# Patient Record
Sex: Male | Born: 1955 | Race: White | Hispanic: No | Marital: Married | Smoking: Never smoker
Health system: Southern US, Community
[De-identification: ages and names within clinical notes are randomized; demographics above are authoritative.]

## PROBLEM LIST (undated history)

## (undated) DIAGNOSIS — Z460 Encounter for fitting and adjustment of spectacles and contact lenses: Secondary | ICD-10-CM

## (undated) DIAGNOSIS — J40 Bronchitis, not specified as acute or chronic: Secondary | ICD-10-CM

## (undated) DIAGNOSIS — E785 Hyperlipidemia, unspecified: Secondary | ICD-10-CM

## (undated) DIAGNOSIS — R569 Unspecified convulsions: Secondary | ICD-10-CM

## (undated) HISTORY — PX: VASECTOMY: SHX75

## (undated) HISTORY — DX: Hyperlipidemia, unspecified: E78.5

## (undated) HISTORY — DX: Unspecified convulsions: R56.9

## (undated) HISTORY — PX: TONSILLECTOMY: SUR1361

---

## 2008-12-25 ENCOUNTER — Ambulatory Visit: Payer: Self-pay | Admitting: Sports Medicine

## 2008-12-25 DIAGNOSIS — M79609 Pain in unspecified limb: Secondary | ICD-10-CM

## 2008-12-25 DIAGNOSIS — M2020 Hallux rigidus, unspecified foot: Secondary | ICD-10-CM

## 2008-12-25 DIAGNOSIS — M7750 Other enthesopathy of unspecified foot: Secondary | ICD-10-CM | POA: Insufficient documentation

## 2008-12-25 DIAGNOSIS — M2140 Flat foot [pes planus] (acquired), unspecified foot: Secondary | ICD-10-CM | POA: Insufficient documentation

## 2009-01-17 ENCOUNTER — Ambulatory Visit: Payer: Self-pay | Admitting: Sports Medicine

## 2009-03-31 ENCOUNTER — Emergency Department (HOSPITAL_COMMUNITY): Admission: EM | Admit: 2009-03-31 | Discharge: 2009-03-31 | Payer: Self-pay | Admitting: Family Medicine

## 2010-10-18 LAB — POCT RAPID STREP A (OFFICE): Streptococcus, Group A Screen (Direct): POSITIVE — AB

## 2010-11-04 ENCOUNTER — Encounter: Payer: Self-pay | Admitting: *Deleted

## 2010-11-13 ENCOUNTER — Encounter: Payer: Self-pay | Admitting: Family Medicine

## 2010-11-13 ENCOUNTER — Ambulatory Visit: Payer: Self-pay | Admitting: Family Medicine

## 2010-11-13 ENCOUNTER — Ambulatory Visit (INDEPENDENT_AMBULATORY_CARE_PROVIDER_SITE_OTHER): Payer: BC Managed Care – PPO | Admitting: Family Medicine

## 2010-11-13 VITALS — BP 143/93 | HR 66 | Ht 67.0 in | Wt 200.0 lb

## 2010-11-13 DIAGNOSIS — M79609 Pain in unspecified limb: Secondary | ICD-10-CM

## 2010-11-13 DIAGNOSIS — M79672 Pain in left foot: Secondary | ICD-10-CM

## 2010-11-13 NOTE — Patient Instructions (Signed)
You have plantar fasciitis Take tylenol or aleve as needed for pain  Plantar fascia stretch for 20-30 seconds (do 3 of these) in morning Lowering/raise on a step exercises 3 x 15 once or twice a day - this is very important for long term recovery. Can add heel walks, toe walks forward and backward as well Ice bucket 10-15 minutes at end of day Avoid flat shoes/barefoot walking as much as possible. Arch straps have been shown to help with pain. Heel lifts also help with pain by avoiding fully stretching the plantar fascia except when doing home exercises (your inserts have this built into them). Steroid injection is a consideration for short term pain relief if you are struggling (better when pain is on inside of heel though, unlikely to help you). Physical therapy is also an option. >90% improve by 12 months with or without treatment but the above things improve the condition faster. Follow up with me in 6 weeks or as needed (if you are better).

## 2010-11-16 ENCOUNTER — Encounter: Payer: Self-pay | Admitting: Family Medicine

## 2010-11-16 DIAGNOSIS — M79672 Pain in left foot: Secondary | ICD-10-CM | POA: Insufficient documentation

## 2010-11-16 NOTE — Progress Notes (Signed)
  Subjective:    Patient ID: Darrell Hendrix, male    DOB: 1956/03/22, 55 y.o.   MRN: [id number]  HPI  55 yo M here for left heel pain.  Patient reports no known injury Had similar pain back in 1998 for which had cortisone injection which helped Pain started plantar portion of heel about 3-4 months ago. Worse in the mornings, better during day and with stretching. Standing after prolonged sitting also worsens pain. Has been icing Using custom orthotics - buys comforthotics as well and changes pads himself.  History reviewed. No pertinent past medical history.  Current Outpatient Prescriptions on File Prior to Visit  Medication Sig Dispense Refill  . Ascorbic Acid (VITAMIN C) 500 MG tablet NO INSTRUCTIONS GIVEN       . Glucosamine 500 MG CAPS NO INSTRUCTIONS GIVEN       . Omega-3 Fatty Acids (FISH OIL) 1000 MG CAPS NO INSTRUCTIONS GIVEN       . thiamine 500 MG tablet NO INSTRUCTIONS GIVEN (VITAMIN B-1 500 MG TABS)         History reviewed. No pertinent past surgical history.  No Known Allergies  History   Social History  . Marital Status: Single    Spouse Name: N/A    Number of Children: N/A  . Years of Education: N/A   Occupational History  . Not on file.   Social History Main Topics  . Smoking status: Never Smoker   . Smokeless tobacco: Never Used  . Alcohol Use: Not on file  . Drug Use: Not on file  . Sexually Active: Not on file   Other Topics Concern  . Not on file   Social History Narrative  . No narrative on file    Family History  Problem Relation Age of Onset  . Heart attack Father   . Diabetes Neg Hx   . Hypertension Neg Hx     BP 143/93  Pulse 66  Ht 5\' 7"  (1.702 m)  Wt 200 lb (90.719 kg)  BMI 31.32 kg/m2  Review of Systems See HPI above.    Objective:   Physical Exam Gen: NAD L foot: Mod overpronation with pes planus.  No other deformity, swelling, bruising. TTP plantar portion of foot proximal plantar fascia and at insertion on  heel laterally.  No achilles TTP.  No other TTP about foot/ankle. FROM ankle with 5/5 strength all motions. Negative ant drawer, talar tilt, thompsons. NVI distally.     Assessment & Plan:  1. Left foot pain - 2/2 plantar fasciitis.  Metatarsal pads and scaphoid pads placed on comforthotics.  Arch strap provided.  Shown home exercises and stretches to do daily.  Icing, tylenol/aleve as needed.  Pain is more lateral in plantar fascia - can consider injection if not improving and pain worsens over next 6 weeks.  See instructions for further.

## 2010-11-16 NOTE — Assessment & Plan Note (Signed)
2/2 plantar fasciitis.  Metatarsal pads and scaphoid pads placed on comforthotics.  Arch strap provided.  Shown home exercises and stretches to do daily.  Icing, tylenol/aleve as needed.  Pain is more lateral in plantar fascia - can consider injection if not improving and pain worsens over next 6 weeks.  See instructions for further.

## 2011-09-30 ENCOUNTER — Other Ambulatory Visit: Payer: Self-pay | Admitting: Neurology

## 2011-09-30 DIAGNOSIS — R442 Other hallucinations: Secondary | ICD-10-CM

## 2011-09-30 DIAGNOSIS — Z87898 Personal history of other specified conditions: Secondary | ICD-10-CM

## 2011-10-07 ENCOUNTER — Other Ambulatory Visit (HOSPITAL_COMMUNITY): Payer: Self-pay | Admitting: Neurology

## 2011-10-07 DIAGNOSIS — R569 Unspecified convulsions: Secondary | ICD-10-CM

## 2011-10-15 ENCOUNTER — Ambulatory Visit (HOSPITAL_COMMUNITY): Payer: BC Managed Care – PPO

## 2011-10-17 ENCOUNTER — Other Ambulatory Visit: Payer: BC Managed Care – PPO

## 2012-02-12 ENCOUNTER — Ambulatory Visit (INDEPENDENT_AMBULATORY_CARE_PROVIDER_SITE_OTHER): Payer: BC Managed Care – PPO | Admitting: Sports Medicine

## 2012-02-12 VITALS — BP 144/91 | Ht 67.0 in | Wt 210.0 lb

## 2012-02-12 DIAGNOSIS — M79609 Pain in unspecified limb: Secondary | ICD-10-CM

## 2012-02-12 DIAGNOSIS — M79672 Pain in left foot: Secondary | ICD-10-CM

## 2012-02-12 NOTE — Progress Notes (Signed)
Darrell Hendrix is a 55 y.o. male who presents to [facility] today for left heel pain.  Patient has had pain in his left heel for approximately 2 months.  This worsened when he restarted bicycling for exercise.  It was associated with the injury to his left calf that has resolved on its on in the interim.  He has been diagnosed with plantar fasciitis in the past and is wearing custom orthotics and most of his shoes.  He denies any numbness or tingle into his feet.  The pain is in the posterior plantar aspect of his left heel.     PMH reviewed.  History  Substance Use Topics  . Smoking status: Never Smoker   . Smokeless tobacco: Never Used  . Alcohol Use: Not on file   ROS as above otherwise neg   Exam:  BP 144/91  Ht 5\' 7"  (1.702 m)  Wt 210 lb (95.255 kg)  BMI 32.89 kg/m2 Gen: Well NAD MSK: Feet:  Significant pes planus bilaterally left worse than right with mid foot valgus and some subluxation.   He has posterior tibialis functioning on standing on toes.  MSK ultrasound: Spurring present on the plantar surface of the calcaneus associated with a thickened plantar fascia of 0.56 cm.

## 2012-02-12 NOTE — Patient Instructions (Addendum)
Thank you for coming in today. Please try  1) Ice your heel 15 mins 1-2x a day.  2) Slow calf raises and drops 15 reps 3 sets 2-3x a day.  3) Toe walking multiple sets a day.  4) Pull toe back and massage heel and mid foot.  Come back if your still have pain in 4-6 weeks.

## 2013-08-02 ENCOUNTER — Encounter (INDEPENDENT_AMBULATORY_CARE_PROVIDER_SITE_OTHER): Payer: Self-pay | Admitting: General Surgery

## 2013-08-11 ENCOUNTER — Ambulatory Visit (INDEPENDENT_AMBULATORY_CARE_PROVIDER_SITE_OTHER): Payer: BC Managed Care – PPO | Admitting: General Surgery

## 2013-08-17 ENCOUNTER — Encounter: Payer: Self-pay | Admitting: Emergency Medicine

## 2013-08-17 ENCOUNTER — Ambulatory Visit
Admission: RE | Admit: 2013-08-17 | Discharge: 2013-08-17 | Disposition: A | Discharge status: Home / Self Care | Payer: BC Managed Care – PPO | Source: Ambulatory Visit | Attending: Sports Medicine | Admitting: Sports Medicine

## 2013-08-17 ENCOUNTER — Ambulatory Visit (INDEPENDENT_AMBULATORY_CARE_PROVIDER_SITE_OTHER): Payer: BC Managed Care – PPO | Admitting: Emergency Medicine

## 2013-08-17 VITALS — BP 147/83 | Ht 67.0 in | Wt 225.0 lb

## 2013-08-17 DIAGNOSIS — M25522 Pain in left elbow: Secondary | ICD-10-CM

## 2013-08-17 DIAGNOSIS — M25529 Pain in unspecified elbow: Secondary | ICD-10-CM

## 2013-08-18 ENCOUNTER — Encounter: Payer: Self-pay | Admitting: Emergency Medicine

## 2013-08-18 DIAGNOSIS — M25522 Pain in left elbow: Secondary | ICD-10-CM | POA: Insufficient documentation

## 2013-08-18 NOTE — Assessment & Plan Note (Signed)
His exam today is concerning for intra-articular bony pathology and/or loose body within the joint. We'll start with an x-ray for further examination the elbow he will followup.

## 2013-08-18 NOTE — Progress Notes (Signed)
Patient ID: Wandra Arthurs., male   DOB: 1956-02-10, 58 y.o.   MRN: [id number] 58 year old male presents with complaint of left elbow pain. Gradually worsening left elbow pain for approximately 3 months. Onset with lifting weights. Complains of achy diffuse elbow pain. Reports limited ability to fully flex the elbow when doing arm curls. Pain is in the posterior lateral and posterior medial aspect of his left elbow. He does report that it got subjectively better with rest however started bothering him again and he continued to increase his activity.  No acute injury noted. No pain with grip.  Denies swelling.  Past medical history: Hernia surgery until this month.  Social history: Nonsmoker no alcohol  Review of systems:  No weakness or numbness noted, no shoulder pain no wrist pain. No fevers or chills. All other systems noted a as per history of present illness otherwise negative.  Examination: BP 147/83  Ht 5\' 7"  (1.702 m)  Wt 225 lb (102.059 kg)  BMI 35.23 kg/m2 Well-developed well-nourished 58 year old white male awake alert oriented no acute distress  Left elbow:  No swelling noted, negative ECRB testing, no tenderness to palpation over the medial or lateral epicondyles, no tenderness to palpation in the insertion of the tricep tendon, no bicipital tendon tenderness noted. Full range of motion with pronation supination flexion extension however, patient reports pain with extreme flexion of the elbow. No effusion or erythema noted.  Examination of the left older and wrist are unremarkable, as is examination of the right upper extremity  Neurovascularly intact bilateral upper extremities with equal pulses.

## 2013-08-19 ENCOUNTER — Encounter (INDEPENDENT_AMBULATORY_CARE_PROVIDER_SITE_OTHER): Payer: Self-pay

## 2013-08-19 ENCOUNTER — Encounter (INDEPENDENT_AMBULATORY_CARE_PROVIDER_SITE_OTHER): Payer: Self-pay | Admitting: General Surgery

## 2013-08-19 ENCOUNTER — Ambulatory Visit (INDEPENDENT_AMBULATORY_CARE_PROVIDER_SITE_OTHER): Payer: BC Managed Care – PPO | Admitting: General Surgery

## 2013-08-19 VITALS — BP 156/100 | HR 76 | Temp 98.0°F | Resp 14 | Ht 67.0 in | Wt 232.6 lb

## 2013-08-19 DIAGNOSIS — K429 Umbilical hernia without obstruction or gangrene: Secondary | ICD-10-CM | POA: Insufficient documentation

## 2013-08-19 NOTE — Patient Instructions (Signed)
Hernia A hernia occurs when an internal organ pushes out through a weak spot in the abdominal wall. Hernias most commonly occur in the groin and around the navel. Hernias often can be pushed back into place (reduced). Most hernias tend to get worse over time. Some abdominal hernias can get stuck in the opening (irreducible or incarcerated hernia) and cannot be reduced. An irreducible abdominal hernia which is tightly squeezed into the opening is at risk for impaired blood supply (strangulated hernia). A strangulated hernia is a medical emergency. Because of the risk for an irreducible or strangulated hernia, surgery may be recommended to repair a hernia. CAUSES   Heavy lifting.  Prolonged coughing.  Straining to have a bowel movement.  A cut (incision) made during an abdominal surgery. HOME CARE INSTRUCTIONS   Bed rest is not required. You may continue your normal activities.    Cough gently. If you are a smoker it is best to stop. Even the best hernia repair can break down with the continual strain of coughing. Even if you do not have your hernia repaired, a cough will continue to aggravate the problem.  Do not wear anything tight over your hernia. Do not try to keep it in with an outside bandage or truss. These can damage abdominal contents if they are trapped within the hernia sac.  Eat a normal diet.  Avoid constipation. Straining over long periods of time will increase hernia size and encourage breakdown of repairs. If you cannot do this with diet alone, stool softeners may be used. SEEK IMMEDIATE MEDICAL CARE IF:   You have a fever.  You develop increasing abdominal pain.  You feel nauseous or vomit.  Your hernia is stuck outside the abdomen, looks discolored, feels hard, or is tender.  You have any changes in your bowel habits or in the hernia that are unusual for you.  You have increased pain or swelling around the hernia.  You cannot push the hernia back in place by  applying gentle pressure while lying down. MAKE SURE YOU:   Understand these instructions.  Will watch your condition.  Will get help right away if you are not doing well or get worse. Document Released: 06/30/2005 Document Revised: 09/22/2011 Document Reviewed: 02/17/2008 [facility] Patient Information 2014 [place].

## 2013-08-19 NOTE — Progress Notes (Signed)
Patient ID: Darrell Arthurs., male   DOB: 1955-08-06, 58 y.o.   MRN: [id number]  Chief Complaint  Patient presents with  . New Evaluation    eval UMB hernia    HPI Darrell Hendrix. is a 58 y.o. male.   HPI 58 yo WM referred by Dr Hulan Fess for evaluation of umbilical hernia. The patient noticed a bulge at his umbilicus about 7-8/2 months ago. He is very active and works out quite a lot. He tends to lift weights very frequently. It doesn't really cause him any pain or discomfort. It has never been hard or swollen. He does notice when he does lift weights that it tends to pop out. It is always reducible. He does not smoke. He had a recent left arm injury and hasn't been able to lift or work out as much. He is a Radio producer. Past Medical History  Diagnosis Date  . Hyperlipidemia   . Seizures     Past Surgical History  Procedure Laterality Date  . Vasectomy      Family History  Problem Relation Age of Onset  . Heart attack Father   . Diabetes Neg Hx   . Hypertension Neg Hx   . Cancer Maternal Aunt     pancreatic  . Cancer Paternal Aunt     pancreatic    Social History History  Substance Use Topics  . Smoking status: Never Smoker   . Smokeless tobacco: Never Used  . Alcohol Use: Yes     Comment: moderate    No Known Allergies  Current Outpatient Prescriptions  Medication Sig Dispense Refill  . Ascorbic Acid (VITAMIN C) 500 MG tablet NO INSTRUCTIONS GIVEN       . Glucosamine 500 MG CAPS NO INSTRUCTIONS GIVEN       . Omega-3 Fatty Acids (FISH OIL) 1000 MG CAPS NO INSTRUCTIONS GIVEN       . thiamine 500 MG tablet NO INSTRUCTIONS GIVEN (VITAMIN B-1 500 MG TABS)        No current facility-administered medications for this visit.    Review of Systems Review of Systems  Constitutional: Negative for fever, chills, appetite change and unexpected weight change.  HENT: Negative for congestion and trouble swallowing.   Eyes: Negative for visual disturbance.    Respiratory: Negative for chest tightness and shortness of breath.   Cardiovascular: Negative for chest pain and leg swelling.       No PND, no orthopnea, no DOE  Gastrointestinal:       See HPI  Genitourinary: Negative for dysuria and hematuria.  Musculoskeletal: Negative.   Skin: Negative for rash.  Neurological: Negative for seizures and speech difficulty.       Denies amaurosis fugax and TIA  Hematological: Does not bruise/bleed easily.  Psychiatric/Behavioral: Negative for behavioral problems and confusion.    Blood pressure 156/100, pulse 76, temperature 98 F (36.7 C), temperature source Oral, resp. rate 14, height 5\' 7"  (1.702 m), weight 232 lb 9.6 oz (105.507 kg).  Physical Exam Physical Exam  Vitals reviewed. Constitutional: He is oriented to person, place, and time. He appears well-developed and well-nourished. No distress.  Stocky, muscular  HENT:  Head: Normocephalic and atraumatic.  Right Ear: External ear normal.  Left Ear: External ear normal.  Eyes: Conjunctivae are normal. No scleral icterus.  Neck: Normal range of motion. Neck supple. No tracheal deviation present. No thyromegaly present.  Cardiovascular: Normal rate, normal heart sounds and intact distal pulses.   Pulmonary/Chest: Effort  normal and breath sounds normal. No respiratory distress. He has no wheezes.  Abdominal: Soft. He exhibits no distension. There is no tenderness. There is no rebound and no guarding.    Small umbilical hernia, defect about 1.5-2cm. Soft, NT. Nothing protruding.   Musculoskeletal: Normal range of motion. He exhibits no edema and no tenderness.  Lymphadenopathy:    He has no cervical adenopathy.  Neurological: He is alert and oriented to person, place, and time. He exhibits normal muscle tone.  Skin: Skin is warm and dry. No rash noted. He is not diaphoretic. No erythema. No pallor.  Psychiatric: He has a normal mood and affect. His behavior is normal. Judgment and thought  content normal.    Data Reviewed Dr Rex Kras office note  Assessment    Umbilical hernia Elevated blood pressure     Plan    We discussed the etiology of umbilical hernias. We discussed the signs and symptoms of incarceration and strangulation. The patient was given educational material. I also drew diagrams.  We discussed nonoperative and operative management. With respect to operative management, we discussed  open repair  The patient has elected to proceed with OPEN REPAIR OF UMBILICAL HERNIA WITH POSSIBLE MESH  We discussed the risk and benefits of surgery including but not limited to bleeding, infection, injury to surrounding structures, hernia recurrence, mesh complications, hematoma/seroma formation, blood clot formation, urinary retention, post operative ileus, general anesthesia risk. We discussed the importance of avoiding heavy lifting and straining for a period of 4 weeks.   With respect to his blood pressure I advised him to check his blood pressure at random times as well as every morning. If his blood pressure remains elevated I advised him to followup with his primary care physician  Darrell Hendrix. [name], MD, FACS General, Bariatric, & Minimally Invasive Surgery [facility], [place]        [facility] M 08/19/2013, 3:36 PM

## 2013-08-22 ENCOUNTER — Telehealth: Payer: Self-pay | Admitting: Sports Medicine

## 2013-08-22 ENCOUNTER — Telehealth: Payer: Self-pay | Admitting: *Deleted

## 2013-08-22 NOTE — Telephone Encounter (Signed)
Message copied by Thurman Coyer on Mon Aug 22, 2013  8:22 AM ------      Message from: [facility], TODD M      Created: Thu Aug 18, 2013 11:28 AM                   ----- Message -----         From: Rad Results In Interface         Sent: 08/17/2013   4:45 PM           To: Hennie Duos, MD             ------

## 2013-08-22 NOTE — Telephone Encounter (Signed)
Message copied by Ocie Bob on Mon Aug 22, 2013  8:51 AM ------      Message from: Lilia Argue R      Created: Mon Aug 22, 2013  8:24 AM      Regarding: referral       Please refer this patient to Dr. Amada Jupiter. 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 See if the patient can be seen sometime Tuesday afternoon.            ----- Message -----         From: Hennie Duos, MD         Sent: 08/18/2013  11:28 AM           To: Thurman Coyer, DO                        ----- Message -----         From: Rad Results In Interface         Sent: 08/17/2013   4:45 PM           To: Hennie Duos, MD                   ------

## 2013-08-22 NOTE — Telephone Encounter (Signed)
Scheduled pt for appt with Dr. Amada Jupiter 08/23/13 at 3:30 pm.

## 2013-08-22 NOTE — Telephone Encounter (Signed)
I spoke with the patient on the phone last week regarding x-rays of his left elbow. He has significant degenerative changes in this elbow. His history makes me suspicious of loose bodies as well although they are difficult to see on plain x-ray. At this point in time I think it would be best that he see Dr. Amada Jupiter for surgical consultation. I'm not sure that further diagnostic imaging is needed but I will leave this to the discretion of Dr. Percell Miller with further treatment per Dr. Debroah Loop discretion as well.

## 2013-09-16 ENCOUNTER — Telehealth (INDEPENDENT_AMBULATORY_CARE_PROVIDER_SITE_OTHER): Payer: Self-pay | Admitting: General Surgery

## 2013-09-16 NOTE — Telephone Encounter (Signed)
FYI coordinated surgery w Dr Percell Miller date same 3/26 location changed to [place]

## 2013-09-26 ENCOUNTER — Other Ambulatory Visit: Payer: Self-pay | Admitting: Physician Assistant

## 2013-09-28 ENCOUNTER — Telehealth (INDEPENDENT_AMBULATORY_CARE_PROVIDER_SITE_OTHER): Payer: Self-pay | Admitting: *Deleted

## 2013-09-28 NOTE — Telephone Encounter (Signed)
Patient Darrell Hendrix on my VM regarding questions he has about lab work needed prior to surgery.  There are no orders in for labs.  Please address.

## 2013-09-29 NOTE — Telephone Encounter (Signed)
LMOM for patient to let him know that Dr [name] doesn't particularly need any pre op labs. Anesthesia will order any if they feel they are needed

## 2013-09-29 NOTE — Telephone Encounter (Signed)
i don't particularly need any preop labs. Anesthesia will order any if they feel they are needed

## 2013-10-03 ENCOUNTER — Encounter (HOSPITAL_BASED_OUTPATIENT_CLINIC_OR_DEPARTMENT_OTHER): Payer: Self-pay | Admitting: *Deleted

## 2013-10-03 NOTE — Progress Notes (Signed)
No labs needed-having umb hernia and elbow done-was sick last week-bronchitis-got meds-no fever-will call surgeon if still sick wed

## 2013-10-05 NOTE — H&P (Signed)
[facility] [address] [place], [place] [address] [phone] A Division of [facility]  Ninetta Lights, M.D.   Robert A. Noemi Chapel, M.D.   Faythe Casa, M.D.   Johnny Bridge, M.D.   Almedia Balls, M.D. Ernesta Amble. Percell Miller, M.D.  Joseph Pierini, M.D.  Lanier Prude, M.D.    Verner Chol, M.D. Mary L. Fenton Malling, PA-C  Kirstin A. Shepperson, PA-C  Josh Chilchinbito, PA-C [place], [place]  RE: Darrell Hendrix, Darrell Hendrix   [id number]      DOB: <<DOB>> INITIAL EVALUATION: 08-23-13 Edu is a new patient to the office.  He presents as a 58 year old male with issues of the left elbow.   Although longstanding, worse since December.  Significant episodes of locking, catching, loss of motion of the left elbow.  Relatively diffuse, more posterior than anterior.  No numbness, no  tingling.  Fairly discrete episodes of locking and catching when he tries to push the extremes of motion.  Pronation and supination tolerable.  It is flexion and extension that bother him the most.   Previous x-rays by Dr. Micheline Chapman early in February reveal a fair amount of arthritis.  He is referred for evaluation.  No previous operative intervention.  Works as a Pharmacist, hospital. Remaining history reviewed, updated and included in the chart.  Of note, he has an umbilical hernia and is being evaluated by Dr. [name], general surgery.  He has plans for a mini open repair of this on 10/06/2013 on an outpatient basis.  General exam is outlined and included in the chart.    EXAMINATION: Specifically this is a healthy appearing 58 year old.  5'7". 232 pounds.  Left elbow reveals 7-10 degree soft flexion contracture being limited by fullness and pain on the posterior aspect of his elbow.  He can flex to about 120 with a relatively firm end-point.  There is no instability.  Biceps and triceps intact.  No medial or lateral epicondylitis. Pronation and supination full and  comfortable.  Neurovascularly intact distally.  Opposite right elbow has full motion, no locking and no instability.   X-RAYS: 3-view x-ray of his elbow shows that the joint spaces are maintained but there is relatively significant periarticular spurring of what looks like loose bodies especially in the posterior aspect of the elbow.    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  MRI to look at soft tissue structures as well as loose bodies.  We have talked about the potential for arthroscopic decompression and debridement.  I am going to follow-up with him after his MRI to discuss where we are going to go depending on findings.  We have also talked a little bit about whether this could be done coincidentally with his umbilical hernia repair on an outpatient basis.  After I see his scan,  I will follow-up with Dr. [name] to see if that is an option.  He understands and agrees.     Ninetta Lights, M.D.  Electronically verified by Ninetta Lights, M.D. DFM:gde D 08-23-13 T 08-24-13 Cc:  [name], MD Fax [phone] Cc:  [name], D.O. fax [phone]   [place]. [place],  [id number] [phone] A Division of [facility]  Ninetta Lights, M.D.   Robert A. Noemi Chapel, M.D.   Faythe Casa, M.D.   Vonna Kotyk  Llana Aliment, M.D.   Almedia Balls, M.D. Ernesta Amble. Percell Miller, M.D.  Joseph Pierini, M.D.  Lanier Prude, M.D.    Verner Chol, M.D. Mary L. Fenton Malling, PA-C  Kirstin A. Shepperson, PA-C  Josh Farmington, PA-C [place], [place]  RE: Darrell Hendrix, Darrell Hendrix   [id number]      DOB: [birth year] PROGRESS NOTE: 09-06-13  [name] comes in for follow-up.  I have gone over his MRI report as well as the scan itself and discussed it with him.  Loose body anteriorly blocking flexion.  Off and on but  certainly not better.  Also loose bodies posteriorly with spurs off the tip of the olecranon blocking extension.  There is some mild lateral epicondylitis on the scan but examining him, there is really no medial or lateral epicondylitis. No instability.  Neurovascularly intact.   DISPOSITION: We have discussed definitive treatment.  I have thoroughly emphasized the degree of degenerative changes throughout the elbow.  Because this is a nonweightbearing joint, I think he stands a good chance of getting good improvement with loose body removal, spur removal and generalized debridement.  My hope is to put off progression to a total elbow for a long time and he understands.  I think he is realistic.  We have discussed outpatient arthroscopy.  Procedure, risks, benefits, complications reviewed.   Anticipated outcome and rehab also outlined.  All questions were answered.  I am going to try to do this in coordination with Dr. [name] who is going to be repairing an umbilical hernia on an outpatient basis.  I will see him at the time of operative intervention.      Ninetta Lights, M.D.  Electronically verified by Ninetta Lights, M.D. DFM:gde D 09-06-13 T 09-07-13 Cc:  [name], MD Fax [phone]

## 2013-10-06 ENCOUNTER — Ambulatory Visit (HOSPITAL_BASED_OUTPATIENT_CLINIC_OR_DEPARTMENT_OTHER)
Admission: RE | Admit: 2013-10-06 | Discharge: 2013-10-06 | Disposition: A | Discharge status: Home / Self Care | Payer: BC Managed Care – PPO | Source: Ambulatory Visit | Attending: Orthopedic Surgery | Admitting: Orthopedic Surgery

## 2013-10-06 ENCOUNTER — Encounter (HOSPITAL_BASED_OUTPATIENT_CLINIC_OR_DEPARTMENT_OTHER): Payer: BC Managed Care – PPO | Admitting: Anesthesiology

## 2013-10-06 ENCOUNTER — Ambulatory Visit (HOSPITAL_BASED_OUTPATIENT_CLINIC_OR_DEPARTMENT_OTHER): Payer: BC Managed Care – PPO | Admitting: Anesthesiology

## 2013-10-06 ENCOUNTER — Encounter (HOSPITAL_BASED_OUTPATIENT_CLINIC_OR_DEPARTMENT_OTHER)
Admission: RE | Disposition: A | Discharge status: Home / Self Care | Payer: Self-pay | Source: Ambulatory Visit | Attending: Orthopedic Surgery

## 2013-10-06 ENCOUNTER — Encounter (HOSPITAL_BASED_OUTPATIENT_CLINIC_OR_DEPARTMENT_OTHER): Payer: Self-pay | Admitting: *Deleted

## 2013-10-06 DIAGNOSIS — M659 Unspecified synovitis and tenosynovitis, unspecified site: Secondary | ICD-10-CM | POA: Insufficient documentation

## 2013-10-06 DIAGNOSIS — M24029 Loose body in unspecified elbow: Secondary | ICD-10-CM | POA: Insufficient documentation

## 2013-10-06 DIAGNOSIS — J40 Bronchitis, not specified as acute or chronic: Secondary | ICD-10-CM | POA: Insufficient documentation

## 2013-10-06 DIAGNOSIS — M25829 Other specified joint disorders, unspecified elbow: Secondary | ICD-10-CM

## 2013-10-06 DIAGNOSIS — D1600 Benign neoplasm of scapula and long bones of unspecified upper limb: Secondary | ICD-10-CM | POA: Insufficient documentation

## 2013-10-06 DIAGNOSIS — K429 Umbilical hernia without obstruction or gangrene: Secondary | ICD-10-CM

## 2013-10-06 DIAGNOSIS — E785 Hyperlipidemia, unspecified: Secondary | ICD-10-CM | POA: Insufficient documentation

## 2013-10-06 DIAGNOSIS — M19029 Primary osteoarthritis, unspecified elbow: Secondary | ICD-10-CM | POA: Insufficient documentation

## 2013-10-06 HISTORY — PX: UMBILICAL HERNIA REPAIR: SHX196

## 2013-10-06 HISTORY — PX: ELBOW ARTHROSCOPY: SHX614

## 2013-10-06 HISTORY — DX: Encounter for fitting and adjustment of spectacles and contact lenses: Z46.0

## 2013-10-06 HISTORY — DX: Bronchitis, not specified as acute or chronic: J40

## 2013-10-06 LAB — POCT HEMOGLOBIN-HEMACUE: HEMOGLOBIN: 16.9 g/dL (ref 13.0–17.0)

## 2013-10-06 SURGERY — ARTHROSCOPY, ELBOW, WITH OPEN SURGERY IF INDICATED
Anesthesia: General | Site: Elbow

## 2013-10-06 MED ORDER — MIDAZOLAM HCL 5 MG/5ML IJ SOLN
INTRAMUSCULAR | Status: DC | PRN
  Administered 2013-10-06: 2 mg via INTRAVENOUS

## 2013-10-06 MED ORDER — ONDANSETRON HCL 4 MG PO TABS: 4.0000 mg | ORAL_TABLET | Freq: Three times a day (TID) | ORAL | Status: DC | PRN

## 2013-10-06 MED ORDER — FENTANYL CITRATE 0.05 MG/ML IJ SOLN: 50.0000 ug | INTRAMUSCULAR | Status: DC | PRN

## 2013-10-06 MED ORDER — OXYCODONE HCL 5 MG PO TABS
5.0000 mg | ORAL_TABLET | Freq: Once | ORAL | Status: AC | PRN
  Administered 2013-10-06: 5 mg via ORAL

## 2013-10-06 MED ORDER — PROPOFOL 10 MG/ML IV BOLUS
INTRAVENOUS | Status: DC | PRN
  Administered 2013-10-06: 200 mg via INTRAVENOUS

## 2013-10-06 MED ORDER — FENTANYL CITRATE 0.05 MG/ML IJ SOLN
INTRAMUSCULAR | Status: DC | PRN
  Administered 2013-10-06: 100 ug via INTRAVENOUS
  Administered 2013-10-06 (×4): 50 ug via INTRAVENOUS

## 2013-10-06 MED ORDER — METOCLOPRAMIDE HCL 5 MG PO TABS: 5.0000 mg | ORAL_TABLET | Freq: Three times a day (TID) | ORAL | Status: DC | PRN

## 2013-10-06 MED ORDER — HYDROMORPHONE HCL PF 1 MG/ML IJ SOLN: 0.5000 mg | INTRAMUSCULAR | Status: DC | PRN

## 2013-10-06 MED ORDER — ONDANSETRON HCL 4 MG/2ML IJ SOLN: 4.0000 mg | Freq: Four times a day (QID) | INTRAMUSCULAR | Status: DC | PRN

## 2013-10-06 MED ORDER — LACTATED RINGERS IV SOLN
INTRAVENOUS | Status: DC
  Administered 2013-10-06: 09:00:00 via INTRAVENOUS

## 2013-10-06 MED ORDER — MIDAZOLAM HCL 2 MG/2ML IJ SOLN: 1.0000 mg | INTRAMUSCULAR | Status: DC | PRN

## 2013-10-06 MED ORDER — SODIUM CHLORIDE 0.9 % IR SOLN
Status: DC | PRN
  Administered 2013-10-06: 7000 mL

## 2013-10-06 MED ORDER — OXYCODONE-ACETAMINOPHEN 5-325 MG PO TABS: 1.0000 | ORAL_TABLET | ORAL | Status: DC | PRN

## 2013-10-06 MED ORDER — BISACODYL 5 MG PO TBEC: 5.0000 mg | DELAYED_RELEASE_TABLET | Freq: Every day | ORAL | Status: DC | PRN

## 2013-10-06 MED ORDER — METOCLOPRAMIDE HCL 5 MG/ML IJ SOLN: 10.0000 mg | Freq: Once | INTRAMUSCULAR | Status: DC | PRN

## 2013-10-06 MED ORDER — METHYLPREDNISOLONE ACETATE 80 MG/ML IJ SUSP
INTRAMUSCULAR | Status: DC | PRN
  Administered 2013-10-06: 80 mg

## 2013-10-06 MED ORDER — FENTANYL CITRATE 0.05 MG/ML IJ SOLN
INTRAMUSCULAR | Status: AC
  Filled 2013-10-06: qty 4

## 2013-10-06 MED ORDER — OXYCODONE HCL 5 MG PO TABS
ORAL_TABLET | ORAL | Status: AC
  Filled 2013-10-06: qty 1

## 2013-10-06 MED ORDER — HYDROMORPHONE HCL PF 1 MG/ML IJ SOLN
0.2500 mg | INTRAMUSCULAR | Status: DC | PRN
  Administered 2013-10-06 (×2): 0.5 mg via INTRAVENOUS

## 2013-10-06 MED ORDER — ONDANSETRON HCL 4 MG PO TABS: 4.0000 mg | ORAL_TABLET | Freq: Four times a day (QID) | ORAL | Status: DC | PRN

## 2013-10-06 MED ORDER — SCOPOLAMINE 1 MG/3DAYS TD PT72
1.0000 | MEDICATED_PATCH | TRANSDERMAL | Status: DC
  Administered 2013-10-06: 1.5 mg via TRANSDERMAL

## 2013-10-06 MED ORDER — OXYCODONE HCL 5 MG/5ML PO SOLN: 5.0000 mg | Freq: Once | ORAL | Status: AC | PRN

## 2013-10-06 MED ORDER — BUPIVACAINE-EPINEPHRINE PF 0.25-1:200000 % IJ SOLN
INTRAMUSCULAR | Status: AC
Start: 2013-10-06 — End: 2013-10-06
  Filled 2013-10-06: qty 30

## 2013-10-06 MED ORDER — FENTANYL CITRATE 0.05 MG/ML IJ SOLN
INTRAMUSCULAR | Status: AC
  Filled 2013-10-06: qty 6

## 2013-10-06 MED ORDER — SCOPOLAMINE 1 MG/3DAYS TD PT72
MEDICATED_PATCH | TRANSDERMAL | Status: AC
  Filled 2013-10-06: qty 1

## 2013-10-06 MED ORDER — LACTATED RINGERS IV SOLN: INTRAVENOUS | Status: DC

## 2013-10-06 MED ORDER — MIDAZOLAM HCL 2 MG/2ML IJ SOLN
INTRAMUSCULAR | Status: AC
  Filled 2013-10-06: qty 2

## 2013-10-06 MED ORDER — CEFAZOLIN SODIUM-DEXTROSE 2-3 GM-% IV SOLR
2.0000 g | INTRAVENOUS | Status: AC
  Administered 2013-10-06: 2 g via INTRAVENOUS

## 2013-10-06 MED ORDER — DEXAMETHASONE SODIUM PHOSPHATE 4 MG/ML IJ SOLN
INTRAMUSCULAR | Status: DC | PRN
  Administered 2013-10-06: 10 mg via INTRAVENOUS

## 2013-10-06 MED ORDER — LIDOCAINE HCL (CARDIAC) 20 MG/ML IV SOLN
INTRAVENOUS | Status: DC | PRN
  Administered 2013-10-06: 100 mg via INTRAVENOUS

## 2013-10-06 MED ORDER — BUPIVACAINE HCL (PF) 0.5 % IJ SOLN
INTRAMUSCULAR | Status: DC | PRN
  Administered 2013-10-06: 25 mL

## 2013-10-06 MED ORDER — BUPIVACAINE-EPINEPHRINE PF 0.25-1:200000 % IJ SOLN
INTRAMUSCULAR | Status: DC | PRN
  Administered 2013-10-06: 30 mL via PERINEURAL

## 2013-10-06 MED ORDER — HYDROMORPHONE HCL PF 1 MG/ML IJ SOLN
INTRAMUSCULAR | Status: AC
  Filled 2013-10-06: qty 1

## 2013-10-06 MED ORDER — CHLORHEXIDINE GLUCONATE 4 % EX LIQD: 60.0000 mL | Freq: Once | CUTANEOUS | Status: DC

## 2013-10-06 MED ORDER — METOCLOPRAMIDE HCL 5 MG/ML IJ SOLN: 5.0000 mg | Freq: Three times a day (TID) | INTRAMUSCULAR | Status: DC | PRN

## 2013-10-06 SURGICAL SUPPLY — 99 items
BANDAGE ELASTIC 4 VELCRO ST LF (GAUZE/BANDAGES/DRESSINGS) ×4 IMPLANT
BENZOIN TINCTURE PRP APPL 2/3 (GAUZE/BANDAGES/DRESSINGS) IMPLANT
BLADE CUDA GRT WHITE 3.5 (BLADE) IMPLANT
BLADE CUDA SHAVER 3.5 (BLADE) IMPLANT
BLADE CUTTER GATOR 3.5 (BLADE) ×4 IMPLANT
BLADE GREAT WHITE 4.2 (BLADE) IMPLANT
BLADE GREAT WHITE 4.2MM (BLADE)
BLADE SURG 15 STRL LF DISP TIS (BLADE) ×2 IMPLANT
BLADE SURG 15 STRL SS (BLADE) ×2
BLADE SURG ROTATE 9660 (MISCELLANEOUS) ×4 IMPLANT
BNDG ESMARK 4X9 LF (GAUZE/BANDAGES/DRESSINGS) ×4 IMPLANT
BUR CUDA 2.9 (BURR) IMPLANT
BUR CUDA 2.9MM (BURR)
BUR FULL RADIUS 2.9 (BURR) IMPLANT
BUR FULL RADIUS 2.9MM (BURR)
BUR GATOR 2.9 (BURR) IMPLANT
BUR GATOR 2.9MM (BURR)
BUR OVAL 4.0 (BURR) ×4 IMPLANT
CANISTER SUCT 1200ML W/VALVE (MISCELLANEOUS) IMPLANT
CHLORAPREP W/TINT 26ML (MISCELLANEOUS) ×4 IMPLANT
CLOSURE WOUND 1/2 X4 (GAUZE/BANDAGES/DRESSINGS)
COVER MAYO STAND STRL (DRAPES) ×4 IMPLANT
COVER TABLE BACK 60X90 (DRAPES) ×4 IMPLANT
CUFF TOURNIQUET SINGLE 18IN (TOURNIQUET CUFF) IMPLANT
CUFF TOURNIQUET SINGLE 24IN (TOURNIQUET CUFF) ×4 IMPLANT
CUTTER MENISCUS  4.2MM (BLADE)
CUTTER MENISCUS 4.2MM (BLADE) IMPLANT
DECANTER SPIKE VIAL GLASS SM (MISCELLANEOUS) IMPLANT
DERMABOND ADVANCED (GAUZE/BANDAGES/DRESSINGS) ×2
DERMABOND ADVANCED .7 DNX12 (GAUZE/BANDAGES/DRESSINGS) ×2 IMPLANT
DRAPE ARTHROSCOPY W/POUCH 90 (DRAPES) ×4 IMPLANT
DRAPE OEC MINIVIEW 54X84 (DRAPES) IMPLANT
DRAPE PED LAPAROTOMY (DRAPES) ×4 IMPLANT
DRAPE SURG 17X23 STRL (DRAPES) ×4 IMPLANT
DRAPE U-SHAPE 47X51 STRL (DRAPES) IMPLANT
DRAPE UTILITY XL STRL (DRAPES) ×4 IMPLANT
DRSG TEGADERM 4X4.75 (GAUZE/BANDAGES/DRESSINGS) IMPLANT
DURAPREP 26ML APPLICATOR (WOUND CARE) ×4 IMPLANT
ELECT COATED BLADE 2.86 ST (ELECTRODE) ×4 IMPLANT
ELECT REM PT RETURN 9FT ADLT (ELECTROSURGICAL) ×4
ELECTRODE REM PT RTRN 9FT ADLT (ELECTROSURGICAL) ×2 IMPLANT
GAUZE XEROFORM 1X8 LF (GAUZE/BANDAGES/DRESSINGS) ×4 IMPLANT
GLOVE BIO SURGEON STRL SZ7 (GLOVE) IMPLANT
GLOVE BIO SURGEON STRL SZ7.5 (GLOVE) ×4 IMPLANT
GLOVE BIOGEL PI IND STRL 7.0 (GLOVE) ×4 IMPLANT
GLOVE BIOGEL PI IND STRL 7.5 (GLOVE) IMPLANT
GLOVE BIOGEL PI IND STRL 8 (GLOVE) ×2 IMPLANT
GLOVE BIOGEL PI INDICATOR 7.0 (GLOVE) ×4
GLOVE BIOGEL PI INDICATOR 7.5 (GLOVE)
GLOVE BIOGEL PI INDICATOR 8 (GLOVE) ×2
GLOVE ECLIPSE 6.5 STRL STRAW (GLOVE) ×8 IMPLANT
GLOVE EXAM NITRILE MD LF STRL (GLOVE) ×8 IMPLANT
GLOVE ORTHO TXT STRL SZ7.5 (GLOVE) ×4 IMPLANT
GLOVE SURG SS PI 6.5 STRL IVOR (GLOVE) ×4 IMPLANT
GOWN STRL REUS W/ TWL LRG LVL3 (GOWN DISPOSABLE) ×4 IMPLANT
GOWN STRL REUS W/ TWL XL LVL3 (GOWN DISPOSABLE) ×4 IMPLANT
GOWN STRL REUS W/TWL LRG LVL3 (GOWN DISPOSABLE) ×4
GOWN STRL REUS W/TWL XL LVL3 (GOWN DISPOSABLE) ×4
KIT SHOULDER TRACTION (DRAPES) ×4 IMPLANT
MESH VENTRALEX ST 1-7/10 CRC S (Mesh General) ×4 IMPLANT
NDL SAFETY ECLIPSE 18X1.5 (NEEDLE) IMPLANT
NEEDLE HYPO 18GX1.5 SHARP (NEEDLE)
NEEDLE HYPO 22GX1.5 SAFETY (NEEDLE) IMPLANT
NEEDLE HYPO 25X1 1.5 SAFETY (NEEDLE) ×4 IMPLANT
NS IRRIG 1000ML POUR BTL (IV SOLUTION) ×4 IMPLANT
PACK ARTHROSCOPY DSU (CUSTOM PROCEDURE TRAY) ×4 IMPLANT
PACK BASIN DAY SURGERY FS (CUSTOM PROCEDURE TRAY) ×8 IMPLANT
PAD CAST 4YDX4 CTTN HI CHSV (CAST SUPPLIES) ×2 IMPLANT
PADDING CAST ABS 4INX4YD NS (CAST SUPPLIES)
PADDING CAST ABS COTTON 4X4 ST (CAST SUPPLIES) IMPLANT
PADDING CAST COTTON 4X4 STRL (CAST SUPPLIES) ×2
PENCIL BUTTON HOLSTER BLD 10FT (ELECTRODE) ×4 IMPLANT
SET ARTHROSCOPY TUBING (MISCELLANEOUS) ×2
SET ARTHROSCOPY TUBING LN (MISCELLANEOUS) ×2 IMPLANT
SHEET MEDIUM DRAPE 40X70 STRL (DRAPES) IMPLANT
SLEEVE SCD COMPRESS KNEE MED (MISCELLANEOUS) ×4 IMPLANT
SLING ARM LRG ADULT FOAM STRAP (SOFTGOODS) IMPLANT
SLING ARM MED ADULT FOAM STRAP (SOFTGOODS) IMPLANT
SPLINT FAST PLASTER 5X30 (CAST SUPPLIES)
SPLINT PLASTER CAST FAST 5X30 (CAST SUPPLIES) IMPLANT
SPONGE GAUZE 4X4 12PLY (GAUZE/BANDAGES/DRESSINGS) ×8 IMPLANT
SPONGE LAP 4X18 X RAY DECT (DISPOSABLE) ×4 IMPLANT
STRIP CLOSURE SKIN 1/2X4 (GAUZE/BANDAGES/DRESSINGS) IMPLANT
SUT ETHIBOND 0 MO6 C/R (SUTURE) IMPLANT
SUT ETHILON 3 0 PS 1 (SUTURE) IMPLANT
SUT MON AB 4-0 PC3 18 (SUTURE) ×4 IMPLANT
SUT VIC AB 0 CT1 27 (SUTURE)
SUT VIC AB 0 CT1 27XBRD ANBCTR (SUTURE) IMPLANT
SUT VIC AB 0 SH 27 (SUTURE) IMPLANT
SUT VIC AB 2-0 PS2 27 (SUTURE) IMPLANT
SUT VIC AB 2-0 SH 27 (SUTURE)
SUT VIC AB 2-0 SH 27XBRD (SUTURE) IMPLANT
SUT VICRYL 3-0 CR8 SH (SUTURE) ×4 IMPLANT
SUT VICRYL AB 3 0 TIES (SUTURE) IMPLANT
SYR CONTROL 10ML LL (SYRINGE) ×4 IMPLANT
TOWEL OR 17X24 6PK STRL BLUE (TOWEL DISPOSABLE) ×8 IMPLANT
TOWEL OR NON WOVEN STRL DISP B (DISPOSABLE) ×4 IMPLANT
UNDERPAD 30X30 INCONTINENT (UNDERPADS AND DIAPERS) IMPLANT
WATER STERILE IRR 1000ML POUR (IV SOLUTION) ×4 IMPLANT

## 2013-10-06 NOTE — Anesthesia Preprocedure Evaluation (Signed)
Anesthesia Evaluation  Patient identified by MRN, date of birth, ID band Patient awake    Reviewed: Allergy & Precautions, H&P , NPO status , Patient's Chart, lab work & pertinent test results, reviewed documented beta blocker date and time   Airway Mallampati: II TM Distance: >3 FB Neck ROM: full    Dental   Pulmonary neg pulmonary ROS,  breath sounds clear to auscultation        Cardiovascular negative cardio ROS  Rhythm:regular     Neuro/Psych Seizures -, Well Controlled,  negative psych ROS   GI/Hepatic negative GI ROS, Neg liver ROS,   Endo/Other  negative endocrine ROS  Renal/GU negative Renal ROS  negative genitourinary   Musculoskeletal   Abdominal   Peds  Hematology negative hematology ROS (+)   Anesthesia Other Findings See surgeon's H&P   Reproductive/Obstetrics negative OB ROS                           Anesthesia Physical Anesthesia Plan  ASA: II  Anesthesia Plan: General   Post-op Pain Management:    Induction: Intravenous  Airway Management Planned: Oral ETT  Additional Equipment:   Intra-op Plan:   Post-operative Plan: Extubation in OR  Informed Consent: I have reviewed the patients History and Physical, chart, labs and discussed the procedure including the risks, benefits and alternatives for the proposed anesthesia with the patient or authorized representative who has indicated his/her understanding and acceptance.   Dental Advisory Given  Plan Discussed with: CRNA and Surgeon  Anesthesia Plan Comments:         Anesthesia Quick Evaluation

## 2013-10-06 NOTE — Discharge Instructions (Signed)
[facility]  UMBILICAL OR INGUINAL HERNIA REPAIR: POST OP INSTRUCTIONS  Always review your discharge instruction sheet given to you by the facility where your surgery was performed. IF YOU HAVE DISABILITY OR FAMILY LEAVE FORMS, YOU MUST BRING THEM TO THE OFFICE FOR PROCESSING.   DO NOT GIVE THEM TO YOUR DOCTOR.  1. A  prescription for pain medication may be given to you upon discharge.  Take your pain medication as prescribed, if needed.  If narcotic pain medicine is not needed, then you may take acetaminophen (Tylenol) or ibuprofen (Advil) as needed. 2. Take your usually prescribed medications unless otherwise directed. 3. If you need a refill on your pain medication, please contact your pharmacy.  They will contact our office to request authorization. Prescriptions will not be filled after 5 pm or on week-ends. 4. You should follow a light diet the first 24 hours after arrival home, such as soup and crackers, etc.  Be sure to include lots of fluids daily.  Resume your normal diet the day after surgery. 5. Most patients will experience some swelling and bruising around the umbilicus or in the groin and scrotum.  Ice packs and reclining will help.  Swelling and bruising can take several days to resolve.  6. It is common to experience some constipation if taking pain medication after surgery.  Increasing fluid intake and taking a stool softener (such as Colace) will usually help or prevent this problem from occurring.  A mild laxative (Milk of Magnesia or Miralax) should be taken according to package directions if there are no bowel movements after 48 hours. 7. .  If your surgeon used skin glue on the incision, you may shower in 24 hours.  The glue will flake off over the next 2-3 weeks.  Any sutures or staples will be removed at the office during your follow-up visit. 8. ACTIVITIES:  You may resume regular (light) daily activities beginning the next day--such as daily self-care,  walking, climbing stairs--gradually increasing activities as tolerated.  You may have sexual intercourse when it is comfortable.  Refrain from any heavy lifting or straining until approved by your doctor. a. You may drive when you are no longer taking prescription pain medication, you can comfortably wear a seatbelt, and you can safely maneuver your car and apply brakes. b. RETURN TO WORK: 1-2 weeks WITH RESTRICTIONS!! 9. You should see your doctor in the office for a follow-up appointment approximately 2-3 weeks after your surgery.  Make sure that you call for this appointment within a day or two after you arrive home to insure a convenient appointment time. 10. OTHER INSTRUCTIONS: DO NOT LIFT, PUSH, OR PULL ANYTHING GREATER THAN 10 POUNDS FOR 4 WEEKS    WHEN TO CALL YOUR DOCTOR: 1. Fever over 101.0 2. Inability to urinate 3. Nausea and/or vomiting 4. Extreme swelling or bruising 5. Continued bleeding from incision. 6. Increased pain, redness, or drainage from the incision  The clinic staff is available to answer your questions during regular business hours.  Please dont hesitate to call and ask to speak to one of the nurses for clinical concerns.  If you have a medical emergency, go to the nearest emergency room or call 911.  A surgeon from [facility] is always on call at the hospital   [address], [place], [place], [place]  [address] ?  [address], [place], [place]   [address] [phone] ? [phone] ? FAX [phone] Web site:  [email or web address]  Elbow Arthroscopy, Care After Refer to this sheet in the next few weeks. These instructions provide you with information on caring for yourself after your procedure. Your health care provider may also give you more specific instructions. Your treatment has been planned according to current medical practices, but problems sometimes occur. Call your health care provider if you have any problems or  questions after your procedure. WHAT TO EXPECT AFTER THE PROCEDURE Elbows are often swollen, stiff, and sore following surgery. The recovery time will vary with the procedure done. Splints, casts or bandages will be worn for various amounts of time. HOME CARE INSTRUCTIONS  Wear sling for the next 1-2 days.  May change dressing daily starting on Saturday and replace with band-aids.  May shower on Saturday, but do not soak incisions.  May apply ice for up to 20 minutes at a time for pain and swelling.  Follow up appointment in our office in one week.  If physical therapy and exercises were prescribed by your surgeon, follow and perform them diligently.  Avoid gripping or lifting heavy objects.  It will be normal to be sore for a couple weeks following surgery. See your health care provider if soreness seems to be getting worse rather than better.  Only take over-the-counter or prescription medicines for pain, discomfort, or fever as directed by your health care provider.  Shower. Do not bathe.  Change dressings if necessary as directed.  You may resume normal diet and activities as directed or allowed.  Make an appointment to see your health care provider for removal of stitches or staples.  Applying an ice pack to the operative site may help with discomfort, keep swelling down, and speed up the healing process. To do this:  Put ice in a bag.  Place a towel between your skin and the bag.  Leave the ice on for 20 minutes, 3 4 times a day, for 2 3 days or as directed by your health care provider.  Do range of motion exercises with the elbow as directed.  Keep your elbow elevated and wrapped to keep swelling down and to decrease pain. This also speeds healing. SEEK MEDICAL CARE IF:   There is redness, swelling, or increasing pain in the wound or joint.  Pus is coming from wound.  An unexplained fever develops.  You notice a foul smell coming from the wound or dressing.  There is  a breaking open of the wound (edges not staying together) after sutures or tape has been removed. SEEK IMMEDIATE MEDICAL CARE IF:   You develop a rash.  You have difficulty breathing.  You have chest pain.  You develop any reaction or side effects to medicines given. Document Released: 01/17/2005 Document Revised: 03/02/2013 Document Reviewed: 01/27/2013 [facility] Patient Information 2014 [place], [place].   Post Anesthesia Home Care Instructions  Activity: Get plenty of rest for the remainder of the day. A responsible adult should stay with you for 24 hours following the procedure.  For the next 24 hours, DO NOT: -Drive a car -Paediatric nurse -Drink alcoholic beverages -Take any medication unless instructed by your physician -Make any legal decisions or sign important papers.  Meals: Start with liquid foods such as gelatin or soup. Progress to regular foods as tolerated. Avoid greasy, spicy, heavy foods. If nausea and/or vomiting occur, drink only clear liquids until the nausea and/or vomiting subsides. Call your physician if vomiting continues.  Special Instructions/Symptoms: Your throat may feel dry or sore from the anesthesia  or the breathing tube placed in your throat during surgery. If this causes discomfort, gargle with warm salt water. The discomfort should disappear within 24 hours.

## 2013-10-06 NOTE — Anesthesia Procedure Notes (Signed)
Procedure Name: LMA Insertion Date/Time: 10/06/2013 10:27 AM Performed by: Lieutenant Diego Pre-anesthesia Checklist: Patient identified, Emergency Drugs available, Suction available and Patient being monitored Patient Re-evaluated:Patient Re-evaluated prior to inductionOxygen Delivery Method: Circle System Utilized Preoxygenation: Pre-oxygenation with 100% oxygen Intubation Type: IV induction Ventilation: Mask ventilation without difficulty LMA: LMA inserted LMA Size: 5.0 Number of attempts: 1 Airway Equipment and Method: bite block Placement Confirmation: positive ETCO2 and breath sounds checked- equal and bilateral Tube secured with: Tape Dental Injury: Teeth and Oropharynx as per pre-operative assessment

## 2013-10-06 NOTE — Anesthesia Postprocedure Evaluation (Signed)
Anesthesia Post Note  Patient: Darrell Hendrix.  Procedure(s) Performed: Procedure(s) (LRB): LEFT ARTHROSCOPY ELBOW WITH DEBRIDEMENT EXTENSIVE/ LOOSE BODY EXCISION   (Left) OPEN UMBILICAL HERNIA REPAIR WITH MESH  (N/A)  Anesthesia type: General  Patient location: PACU  Post pain: Pain level controlled  Post assessment: Patient's Cardiovascular Status Stable  Last Vitals:  Filed Vitals:   10/06/13 1422  BP:   Pulse: 99  Temp:   Resp: 18    Post vital signs: Reviewed and stable  Level of consciousness: alert  Complications: No apparent anesthesia complications

## 2013-10-06 NOTE — H&P (Signed)
Darrell Hendrix. is an 58 y.o. male.   Chief Complaint: here for surgery HPI: 58 yo WM referred by Dr Hulan Fess for evaluation of umbilical hernia. The patient noticed a bulge at his umbilicus about 0-0/9 months ago. He is very active and works out quite a lot. He tends to lift weights very frequently. It doesn't really cause him any pain or discomfort. It has never been hard or swollen. He does notice when he does lift weights that it tends to pop out. It is always reducible. He does not smoke. He had a recent left arm injury and hasn't been able to lift or work out as much. He is a Radio producer.   Past Medical History  Diagnosis Date  . Hyperlipidemia   . Seizures     in [employer]-no meds 1998  . Contact lens/glasses fitting     wears contacts or glasses  . Bronchitis     resent bronchitis-went to [facility] dr and got meds    Past Surgical History  Procedure Laterality Date  . Vasectomy    . Tonsillectomy      Family History  Problem Relation Age of Onset  . Heart attack Father   . Diabetes Neg Hx   . Hypertension Neg Hx   . Cancer Maternal Aunt     pancreatic  . Cancer Paternal Aunt     pancreatic   Social History:  reports that he has never smoked. He has never used smokeless tobacco. He reports that he drinks alcohol. He reports that he does not use illicit drugs.  Allergies: No Known Allergies  Medications Prior to Admission  Medication Sig Dispense Refill  . albuterol (PROVENTIL HFA;VENTOLIN HFA) 108 (90 BASE) MCG/ACT inhaler Inhale into the lungs every 6 (six) hours as needed for wheezing or shortness of breath.      . chlorpheniramine-HYDROcodone (TUSSIONEX) 10-8 MG/5ML LQCR Take 5 mLs by mouth.      . fluticasone (FLONASE) 50 MCG/ACT nasal spray Place into both nostrils daily.      . Ascorbic Acid (VITAMIN C) 500 MG tablet NO INSTRUCTIONS GIVEN       . Glucosamine 500 MG CAPS NO INSTRUCTIONS GIVEN       . Omega-3 Fatty Acids (FISH OIL) 1000 MG CAPS NO INSTRUCTIONS  GIVEN       . thiamine 500 MG tablet NO INSTRUCTIONS GIVEN (VITAMIN B-1 500 MG TABS)         Results for orders placed during the hospital encounter of 10/06/13 (from the past 48 hour(s))  POCT HEMOGLOBIN-HEMACUE     Status: None   Collection Time    10/06/13  9:21 AM      Result Value Ref Range   Hemoglobin 16.9  13.0 - 17.0 g/dL   No results found.  Review of Systems  Constitutional: Negative for weight loss.  HENT: Negative for nosebleeds.   Eyes: Negative for blurred vision.  Respiratory: Negative for shortness of breath.        Episode of bronchitis 2 weeks ago - no abx; resolved  Cardiovascular: Negative for chest pain, palpitations, orthopnea and PND.       Denies DOE  Genitourinary: Negative for dysuria and hematuria.  Musculoskeletal: Negative.   Skin: Negative for itching and rash.  Neurological: Negative for dizziness, focal weakness, seizures, loss of consciousness and headaches.       Denies TIAs, amaurosis fugax  Endo/Heme/Allergies: Does not bruise/bleed easily.  Psychiatric/Behavioral: The patient is not nervous/anxious.  Blood pressure 140/97, pulse 78, temperature 98.5 F (36.9 C), temperature source Oral, resp. rate 18, height 5\' 7"  (1.702 m), weight 225 lb 2 oz (102.116 kg), SpO2 96.00%. Physical Exam  Vitals reviewed. Constitutional: He is oriented to person, place, and time. He appears well-developed and well-nourished. No distress.  HENT:  Head: Normocephalic and atraumatic.  Right Ear: External ear normal.  Left Ear: External ear normal.  Eyes: Conjunctivae are normal. No scleral icterus.  Respiratory: Effort normal.  GI: Soft. He exhibits no distension. There is no tenderness.  Small umbilical hernia around 2.3FT  Musculoskeletal: He exhibits no edema.  Neurological: He is alert and oriented to person, place, and time.  Skin: Skin is warm and dry. No rash noted. He is not diaphoretic. No erythema.  Psychiatric: He has a normal mood and affect.  His behavior is normal. Judgment and thought content normal.     Assessment/Plan Umbilical hernia  To OR for open  Umbilical hernia repair possible mesh All questions asked answered  [name]. [name], MD, FACS General, Bariatric, & Minimally Invasive Surgery [facility], [place]   [facility] M 10/06/2013, 10:03 AM

## 2013-10-06 NOTE — Interval H&P Note (Signed)
History and Physical Interval Note:  10/06/2013 7:27 AM  Darrell Hendrix.  has presented today for surgery, with the diagnosis of LEFT ELBOW LOOSE BODY - UPPER ARM  The various methods of treatment have been discussed with the patient and family. After consideration of risks, benefits and other options for treatment, the patient has consented to  Procedure(s): LEFT ARTHROSCOPY ELBOW WITH DEBRIDEMENT EXTENSIVE/ LOOSE BODY EXCISION   (Left) OPEN UMBILICAL HERNIA REPAIR  (N/A) as a surgical intervention .  The patient's history has been reviewed, patient examined, no change in status, stable for surgery.  I have reviewed the patient's chart and labs.  Questions were answered to the patient's satisfaction.     Kester Stimpson F

## 2013-10-06 NOTE — Transfer of Care (Signed)
Immediate Anesthesia Transfer of Care Note  Patient: Darrell Hendrix.  Procedure(s) Performed: Procedure(s): LEFT ARTHROSCOPY ELBOW WITH DEBRIDEMENT EXTENSIVE/ LOOSE BODY EXCISION   (Left) OPEN UMBILICAL HERNIA REPAIR WITH MESH  (N/A)  Patient Location: PACU  Anesthesia Type:General  Level of Consciousness: sedated  Airway & Oxygen Therapy: Patient Spontanous Breathing and Patient connected to face mask oxygen  Post-op Assessment: Report given to PACU RN and Post -op Vital signs reviewed and stable  Post vital signs: Reviewed and stable  Complications: No apparent anesthesia complications

## 2013-10-06 NOTE — Op Note (Signed)
  Darrell Hendrix [id number] [birth year]11   Umbilical Herniorrhaphy with Mesh Procedure Note  Indications: Symptomatic umbilical hernia   Pre-operative Diagnosis: umbilical hernia   Post-operative Diagnosis: umbilical hernia   Surgeon: [name]. [name], MD FACS   Assistants: none   Anesthesia: General endotracheal anesthesia and Local anesthesia 0.25%marcaine with epi   ASA Class: 2  Procedure Details  The patient was seen in the Holding Room. The risks, benefits, complications, treatment options, and expected outcomes were discussed with the patient. The possibilities of reaction to medication, pulmonary aspiration, perforation of viscus, bleeding, recurrent infection, hernia recurrence, the need for additional procedures, failure to diagnose a condition, and creating a complication requiring transfusion or operation were discussed with the patient. The patient concurred with the proposed plan, giving informed consent. The site of surgery properly noted/marked.   The patient was taken to Operating Room # 2 at [facility], identified as Darrell Hendrix. and the procedure verified as Umbilical Herniorrhaphy. A Time Out was held and the above information confirmed. The patient received IV antibiotics  prior to the procedure.   The patient was placed supine. After establishing general anesthesia, the abdomen was prepped and draped in standard fashion. 0.250% Marcaine with epinephrine was used to anesthetize the skin. A curvilinear infraumbilical incision was created. Dissection was carried down to the hernia sac located above the fascia and was mobilized from surrounding structures. Intact fascia was identified circumferentially around the defect. Skin and soft tissue was mobilized from the surface of the fascia in a circumferential manner. A finger sweep was performed underneath the fascia to ensure there were no adhesions to the anterior abdominal wall. A 4.1cm  round Bard ventral patch was then placed thru the defect. The tabs were lifted to ensure that the mesh was flush against the abdominal wall. I then ran my finger around the inside of the defect to ensure nothing was trapped between the mesh and the abdominal wall. A 0-novafil suture was used to secure the base of each tab to the fascia and then the excess tab was trimmed at the fascial level. The fascia was then closed primarily over the mesh with 5 interrupted 0-novafil sutures. The cavity was irrigated and additional local was infiltrated in the subcutaneous tissue and fascia. The umbilical stalk was then tacked back down to the fascia with two 3-0 vicryl sutures. Hemostasis was confirmed. The soft tissue was irrigated and closed in layers with inverted interrupted 3-0 vicryl sutures for the deep dermis. The skin incision was closed with a 4-0 monocryl subcuticular closure. Dermabond was applied at the end of the operation.   Instrument, sponge, and needle counts were correct prior to closure and at the conclusion of the case.   Findings:  A 1.5 cm fascial defect   Estimated Blood Loss: Minimal   Drains: none   Implants: round bard 4.1cm ventral patch   Complications: None; patient tolerated the procedure well.   Disposition: PACU - hemodynamically stable.   Condition: stable  [name]. [name], MD, FACS General, Bariatric, & Minimally Invasive Surgery [facility], [place]

## 2013-10-07 ENCOUNTER — Encounter (HOSPITAL_BASED_OUTPATIENT_CLINIC_OR_DEPARTMENT_OTHER): Payer: Self-pay | Admitting: Orthopedic Surgery

## 2013-10-10 NOTE — Op Note (Signed)
NAME:  Darrell Hendrix, Darrell Hendrix NO.:  [id number]  MEDICAL RECORD NO.:  [id number]  LOCATION:                                 FACILITY:  PHYSICIAN:  Ninetta Lights, M.D. DATE OF BIRTH:  1955-12-09  DATE OF PROCEDURE:  10/06/2013 DATE OF DISCHARGE:  10/06/2013                              OPERATIVE REPORT   PREOPERATIVE DIAGNOSES:  Left elbow traumatic arthrotomy.  Loose bodies. Significant loss of motion.  POSTOPERATIVE DIAGNOSES:  Left elbow traumatic arthrotomy.  Loose bodies.  Significant loss of motion with three large and numerous small loose bodies.  Grade 3 and 4 changes throughout.  Loss of motion primarily from marked abutment spurs, anterior and posterior.  PROCEDURE:  Left elbow exam under anesthesia, arthroscopy.  Extensive debridement of spurs, loose bodies both anterior and posterior.  Three large loose bodies were removed.  Chondroplasty throughout.  Partial synovectomy.  SURGEON:  Ninetta Lights, M.D.  ASSISTANT:  Eula Listen PA, present throughout the entire case and necessary for timely completion of procedure.  ANESTHESIA:  General.  BLOOD LOSS:  Minimal.  SPECIMENS:  None.  CULTURES:  None.  COMPLICATIONS:  None.  DRESSINGS:  Soft compressive.  TOURNIQUET TIME:  1 hour and 40 minutes.  DESCRIPTION OF PROCEDURE:  The patient was brought to the operating room and placed on the operating table in supine position.  After adequate anesthesia had been obtained, tourniquet applied, prepped and draped in usual sterile fashion.  Exsanguinated with elevation of Esmarch. Tourniquet inflated to 250 mmHg.  I utilized the hanging device to support the arm.  Three anterior portals, one anterolateral, two anteromedial.  Protected neurovascular structures.  Arthroscope was introduced, elbow distended and inspected.  Extensive synovitis was debrided throughout the front of the elbow.  One large loose body removed.  Numerous smaller loose bodies  removed.  Chondroplasty throughout.  The entire capitellum radial joint was down the bone.  The block from flexion, which occurred at 100 degrees was primarily from spurring on the front of the humerus anteriorly.  A bur was used to contour that all off to open up the entire front of the space and establish full passive flexion.  When that was complete, attention was turned to the back of the elbow.  Two portals avoiding neurovascular structures.  Extensive synovitis debrided.  Two large loose bodies were removed.  Sequential removal of spurs from the humerus and olecranon. At completion and a thorough debridement, I was able to establish full passive extension.  Instruments and fluid were removed.  Portals were closed with nylon.  Sterile compressive dressing was applied. Tourniquet was deflated and removed.  Anesthesia was reversed.  Brought to the recovery room.  Tolerated the surgery well.  No complications.     Ninetta Lights, M.D.   ______________________________ Ninetta Lights, M.D.    DFM/MEDQ  D:  10/06/2013  T:  10/07/2013  Job:  [id number]

## 2013-10-21 ENCOUNTER — Telehealth (INDEPENDENT_AMBULATORY_CARE_PROVIDER_SITE_OTHER): Payer: Self-pay | Admitting: General Surgery

## 2013-10-21 NOTE — Telephone Encounter (Signed)
LMOM letting pt know he has an appt on 11/03/13 at 10:00.

## 2013-10-21 NOTE — Telephone Encounter (Signed)
Message copied by Flossie Buffy on Fri Oct 21, 2013  3:34 PM ------      Message from: Aviva Signs      Created: Fri Oct 21, 2013  3:06 PM       Pt called for a po apt with Dr [name] for next week do you have a spot we can work him in please... ------

## 2013-11-03 ENCOUNTER — Encounter (INDEPENDENT_AMBULATORY_CARE_PROVIDER_SITE_OTHER): Payer: BC Managed Care – PPO | Admitting: General Surgery

## 2013-12-01 ENCOUNTER — Encounter (INDEPENDENT_AMBULATORY_CARE_PROVIDER_SITE_OTHER): Payer: Self-pay | Admitting: General Surgery

## 2013-12-01 ENCOUNTER — Ambulatory Visit (INDEPENDENT_AMBULATORY_CARE_PROVIDER_SITE_OTHER): Payer: BC Managed Care – PPO | Admitting: General Surgery

## 2013-12-01 VITALS — BP 134/82 | HR 76 | Temp 97.8°F | Resp 16 | Ht 67.0 in | Wt 231.4 lb

## 2013-12-01 DIAGNOSIS — Z09 Encounter for follow-up examination after completed treatment for conditions other than malignant neoplasm: Secondary | ICD-10-CM

## 2013-12-01 NOTE — Patient Instructions (Signed)
Can resume normal activities  

## 2013-12-01 NOTE — Progress Notes (Signed)
Subjective:     Patient ID: Darrell Arthurs., male   DOB: 12/08/1955, 58 y.o.   MRN: [id number]  HPI 58 year old Caucasian male comes in for followup. He underwent open repair of umbilical hernia with mesh on March 26. He states he is doing well. He denies any abdominal pain. He denies any nausea or vomiting. He reports regular bowel movements. He did have some soreness around his incision for about a week.  Review of Systems     Objective:   Physical Exam BP 134/82  Pulse 76  Temp(Src) 97.8 F (36.6 C)  Resp 16  Ht 5\' 7"  (1.702 m)  Wt 231 lb 6.4 oz (104.962 kg)  BMI 36.23 kg/m2 Alert, no apparent stress Abdomen-soft, nontender, nondistended. Well-healed transverse infraumbilical incision. No evidence of hernia recurrence. No hematoma or seroma    Assessment:     Status post open repair of umbilical hernia with mesh     Plan:     Overall he appears to doing quite well. There are no signs of complications. At this point he is released to do normal activities. I reminded him that the repair is not as strong as it was when he was 33 or 58 years old. followup as needed  [name]. [name], MD, FACS General, Bariatric, & Minimally Invasive Surgery [facility], [place]

## 2014-02-24 ENCOUNTER — Encounter (HOSPITAL_COMMUNITY): Payer: Self-pay | Admitting: Emergency Medicine

## 2014-02-24 ENCOUNTER — Emergency Department (HOSPITAL_COMMUNITY): Payer: BC Managed Care – PPO

## 2014-02-24 ENCOUNTER — Emergency Department (HOSPITAL_COMMUNITY)
Admission: EM | Admit: 2014-02-24 | Discharge: 2014-02-24 | Disposition: A | Discharge status: Home / Self Care | Payer: BC Managed Care – PPO | Attending: Emergency Medicine | Admitting: Emergency Medicine

## 2014-02-24 DIAGNOSIS — Z8619 Personal history of other infectious and parasitic diseases: Secondary | ICD-10-CM | POA: Diagnosis not present

## 2014-02-24 DIAGNOSIS — R109 Unspecified abdominal pain: Secondary | ICD-10-CM | POA: Insufficient documentation

## 2014-02-24 DIAGNOSIS — J40 Bronchitis, not specified as acute or chronic: Secondary | ICD-10-CM | POA: Insufficient documentation

## 2014-02-24 DIAGNOSIS — Z79899 Other long term (current) drug therapy: Secondary | ICD-10-CM | POA: Insufficient documentation

## 2014-02-24 DIAGNOSIS — N23 Unspecified renal colic: Secondary | ICD-10-CM | POA: Diagnosis not present

## 2014-02-24 LAB — URINALYSIS, ROUTINE W REFLEX MICROSCOPIC
Bilirubin Urine: NEGATIVE
Glucose, UA: NEGATIVE mg/dL
KETONES UR: NEGATIVE mg/dL
Leukocytes, UA: NEGATIVE
Nitrite: NEGATIVE
PROTEIN: NEGATIVE mg/dL
Specific Gravity, Urine: 1.029 (ref 1.005–1.030)
Urobilinogen, UA: 0.2 mg/dL (ref 0.0–1.0)
pH: 5 (ref 5.0–8.0)

## 2014-02-24 LAB — URINE MICROSCOPIC-ADD ON

## 2014-02-24 MED ORDER — ONDANSETRON HCL 8 MG PO TABS: 8.0000 mg | ORAL_TABLET | Freq: Three times a day (TID) | ORAL | Status: AC | PRN

## 2014-02-24 MED ORDER — HYDROMORPHONE HCL 2 MG PO TABS: 2.0000 mg | ORAL_TABLET | ORAL | Status: DC | PRN

## 2014-02-24 MED ORDER — NAPROXEN SODIUM 220 MG PO TABS: ORAL_TABLET | ORAL | Status: DC

## 2014-02-24 MED ORDER — TAMSULOSIN HCL 0.4 MG PO CAPS
0.4000 mg | ORAL_CAPSULE | Freq: Once | ORAL | Status: AC
  Administered 2014-02-24: 0.4 mg via ORAL
  Filled 2014-02-24: qty 1

## 2014-02-24 MED ORDER — ONDANSETRON HCL 8 MG PO TABS: 8.0000 mg | ORAL_TABLET | Freq: Three times a day (TID) | ORAL | Status: DC | PRN

## 2014-02-24 MED ORDER — HYDROMORPHONE HCL PF 1 MG/ML IJ SOLN
1.0000 mg | Freq: Once | INTRAMUSCULAR | Status: AC
  Administered 2014-02-24: 1 mg via INTRAVENOUS
  Filled 2014-02-24: qty 1

## 2014-02-24 MED ORDER — NAPROXEN SODIUM 220 MG PO TABS: ORAL_TABLET | ORAL | Status: AC

## 2014-02-24 MED ORDER — KETOROLAC TROMETHAMINE 30 MG/ML IJ SOLN
30.0000 mg | Freq: Once | INTRAMUSCULAR | Status: AC
  Administered 2014-02-24: 30 mg via INTRAVENOUS
  Filled 2014-02-24: qty 1

## 2014-02-24 MED ORDER — HYDROMORPHONE HCL 2 MG PO TABS
2.0000 mg | ORAL_TABLET | ORAL | Status: AC | PRN
Start: 2014-02-24 — End: ?

## 2014-02-24 MED ORDER — ONDANSETRON HCL 4 MG/2ML IJ SOLN
4.0000 mg | Freq: Once | INTRAMUSCULAR | Status: AC
  Administered 2014-02-24: 4 mg via INTRAVENOUS
  Filled 2014-02-24: qty 2

## 2014-02-24 MED ORDER — TAMSULOSIN HCL 0.4 MG PO CAPS
ORAL_CAPSULE | ORAL | Status: DC
Start: 2014-02-24 — End: 2014-02-24

## 2014-02-24 MED ORDER — SODIUM CHLORIDE 0.9 % IV SOLN
INTRAVENOUS | Status: DC
  Administered 2014-02-24: 125 mL/h via INTRAVENOUS

## 2014-02-24 MED ORDER — TAMSULOSIN HCL 0.4 MG PO CAPS: ORAL_CAPSULE | ORAL | Status: AC

## 2014-02-24 MED ORDER — SODIUM CHLORIDE 0.9 % IV SOLN: INTRAVENOUS | Status: DC

## 2014-02-24 NOTE — Discharge Instructions (Signed)

## 2014-02-24 NOTE — ED Notes (Signed)
Pt presents d/t left sided flank pain since 8/13.  Pt reports not being able to sleep d/t the pain.  Pt rates pain 8/10 pressure in nature.  Pt is pacing in triage room states he is to uncomfortable to sit down.

## 2014-02-24 NOTE — ED Notes (Signed)
Patient says he felt like he passed a stone on Thursday of last week and has been feeling fine. Patient stated this morning he started feeling the pain again and has been nauseated/vomiting this is why he presented to the ED.

## 2014-02-24 NOTE — ED Provider Notes (Signed)
CSN: [id number]     Arrival date & time 02/24/14  0252 History   First MD Initiated Contact with Patient 02/24/14 [phone]     Chief Complaint  Patient presents with  . Flank Pain     (Consider location/radiation/quality/duration/timing/severity/associated sxs/prior Treatment) HPI This is a 58 year old male with severe left-sided flank pain that began this morning about 1AM. It is severe and described as a pressure. It does not change with movement or palpation. It has been associated with urinary urgency, nausea, vomiting and diaphoresis. He has no history of kidney stones. He had a similar pain about a week ago followed by episodes of urinary urgency but those symptoms were minor in comparison to this morning.   Past Medical History  Diagnosis Date  . Hyperlipidemia   . Seizures     in [employer]-no meds 1998  . Contact lens/glasses fitting     wears contacts or glasses  . Bronchitis     resent bronchitis-went to [facility] dr and got meds   Past Surgical History  Procedure Laterality Date  . Vasectomy    . Tonsillectomy    . Elbow arthroscopy Left 10/06/2013    Procedure: LEFT ARTHROSCOPY ELBOW WITH DEBRIDEMENT EXTENSIVE/ LOOSE BODY EXCISION  ;  Surgeon: Ninetta Lights, MD;  Location: [place];  Service: Orthopedics;  Laterality: Left;  . Umbilical hernia repair N/A 10/06/2013    Procedure: OPEN UMBILICAL HERNIA REPAIR WITH MESH ;  Surgeon: Gayland Curry, MD;  Location: [place];  Service: General;  Laterality: N/A;   Family History  Problem Relation Age of Onset  . Heart attack Father   . Diabetes Neg Hx   . Hypertension Neg Hx   . Cancer Maternal Aunt     pancreatic  . Cancer Paternal Aunt     pancreatic   History  Substance Use Topics  . Smoking status: Never Smoker   . Smokeless tobacco: Never Used  . Alcohol Use: Yes     Comment: moderate    Review of Systems  All other systems reviewed and are negative.   Allergies  Review of  patient's allergies indicates no known allergies.  Home Medications   Prior to Admission medications   Medication Sig Start Date End Date Taking? Authorizing Provider  albuterol (PROVENTIL HFA;VENTOLIN HFA) 108 (90 BASE) MCG/ACT inhaler Inhale into the lungs every 6 (six) hours as needed for wheezing or shortness of breath.    Historical Provider, MD  Ascorbic Acid (VITAMIN C) 500 MG tablet NO INSTRUCTIONS GIVEN     Historical Provider, MD  bisacodyl (DULCOLAX) 5 MG EC tablet Take 1 tablet (5 mg total) by mouth daily as needed for moderate constipation. 10/06/13   M. Doran Stabler, PA-C  chlorpheniramine-HYDROcodone (TUSSIONEX) 10-8 MG/5ML LQCR Take 5 mLs by mouth.    Historical Provider, MD  fluticasone (FLONASE) 50 MCG/ACT nasal spray Place into both nostrils daily.    Historical Provider, MD  Glucosamine 500 MG CAPS NO INSTRUCTIONS GIVEN     Historical Provider, MD  Omega-3 Fatty Acids (FISH OIL) 1000 MG CAPS NO INSTRUCTIONS GIVEN     Historical Provider, MD  ondansetron (ZOFRAN) 4 MG tablet Take 1 tablet (4 mg total) by mouth every 8 (eight) hours as needed for nausea or vomiting. 10/06/13   M. Doran Stabler, PA-C  thiamine 500 MG tablet NO INSTRUCTIONS GIVEN (VITAMIN B-1 500 MG TABS)     Historical Provider, MD   BP 184/110  Pulse 69  Temp(Src)  97.8 F (36.6 C) (Oral)  Resp 24  SpO2 100%  Physical Exam General: Well-developed, well-nourished male in obvious discomfort; appearance consistent with age of record HENT: normocephalic; atraumatic Eyes: pupils equal, round and reactive to light; extraocular muscles intact Neck: supple Heart: regular rate and rhythm Lungs: clear to auscultation bilaterally Abdomen: soft; nondistended; nontender; no masses or hepatosplenomegaly; bowel sounds present GU: No CVA tenderness Extremities: No deformity; full range of motion; pulses normal Neurologic: Awake, alert and oriented; motor function intact in all extremities and symmetric; no facial  droop Skin: Diaphoretic Psychiatric: Agitated; anxious     ED Course  Procedures (including critical care time)  MDM   Nursing notes and vitals signs, including pulse oximetry, reviewed.  Summary of this visit's results, reviewed by myself:  Labs:  Results for orders placed during the hospital encounter of 02/24/14 (from the past 24 hour(s))  URINALYSIS, ROUTINE W REFLEX MICROSCOPIC     Status: Abnormal   Collection Time    02/24/14  3:27 AM      Result Value Ref Range   Color, Urine YELLOW  YELLOW   APPearance CLEAR  CLEAR   Specific Gravity, Urine 1.029  1.005 - 1.030   pH 5.0  5.0 - 8.0   Glucose, UA NEGATIVE  NEGATIVE mg/dL   Hgb urine dipstick MODERATE (*) NEGATIVE   Bilirubin Urine NEGATIVE  NEGATIVE   Ketones, ur NEGATIVE  NEGATIVE mg/dL   Protein, ur NEGATIVE  NEGATIVE mg/dL   Urobilinogen, UA 0.2  0.0 - 1.0 mg/dL   Nitrite NEGATIVE  NEGATIVE   Leukocytes, UA NEGATIVE  NEGATIVE  URINE MICROSCOPIC-ADD ON     Status: None   Collection Time    02/24/14  3:27 AM      Result Value Ref Range   RBC / HPF 7-10  <3 RBC/hpf   Urine-Other MUCOUS PRESENT      Imaging Studies: Ct Renal Stone Study  02/24/2014   CLINICAL DATA:  Left flank pain.  EXAM: CT RENAL STONE PROTOCOL  TECHNIQUE: Multidetector CT imaging of the abdomen and pelvis was performed following the standard protocol without intravenous contrast  COMPARISON:  None.  FINDINGS: The visualized lung bases are clear.  The liver and spleen are unremarkable in appearance. The gallbladder is within normal limits. The pancreas and adrenal glands are unremarkable.  There is moderate left-sided hydronephrosis, with left-sided perinephric stranding, and prominence of the left ureter along its entire course. A 6 mm stone is noted at the left side of the base of the bladder. This may reflect a recently passed stone, or a stone just barely within the left vesicoureteral junction.  The right kidney is unremarkable in appearance.  No nonobstructing renal stones are identified.  No free fluid is identified. The small bowel is unremarkable in appearance. The stomach is within normal limits. No acute vascular abnormalities are seen.  The appendix is decompressed and difficult to fully assess, without evidence for appendicitis. The colon is unremarkable appearance.  The bladder is decompressed and not well assessed. The prostate remains normal in size. No inguinal lymphadenopathy is seen.  No acute osseous abnormalities are identified.  IMPRESSION: Moderate left-sided hydronephrosis, with diffuse prominence of the left ureter. A 6 mm stone is noted at the left side of the base of the bladder. This may reflect a recently passed stone, or a stone just barely within the left vesicoureteral junction.   Electronically Signed   By: Garald Balding M.D.   On: 02/24/2014 04:02  4:09 AM Pain well-controlled with IV medications. Patient advised of CT findings and the likelihood of passing the stone on his own. We will refer him to urology should the stone not pass on its own.     Wynetta Fines, MD 02/24/14 [phone]

## 2015-08-29 IMAGING — CT CT RENAL STONE PROTOCOL
1 series · 11 of 13 positions shown, 17 images · non-contrast
Comparison: None.

CLINICAL DATA: Left flank pain.

EXAM:
CT RENAL STONE PROTOCOL
TECHNIQUE: Multidetector CT imaging of the abdomen and pelvis was performed
following the standard protocol without intravenous contrast

[Series 3: lung · axial · 0.76mm/px · z∈[+1410,+1460]mm · 11 of 13 slices shown, 17 images]
[im 2/13  soft-tissue]
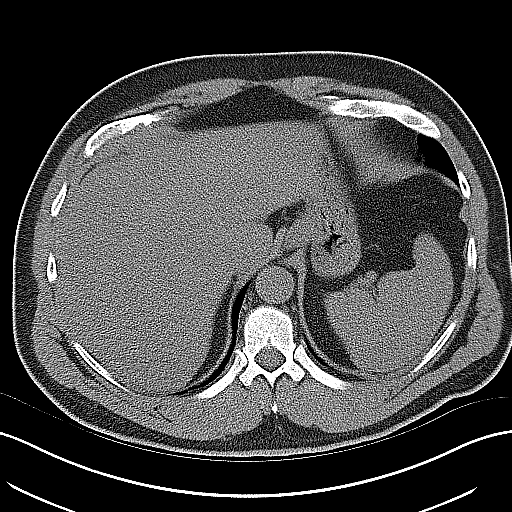
[im 2/13  bone]
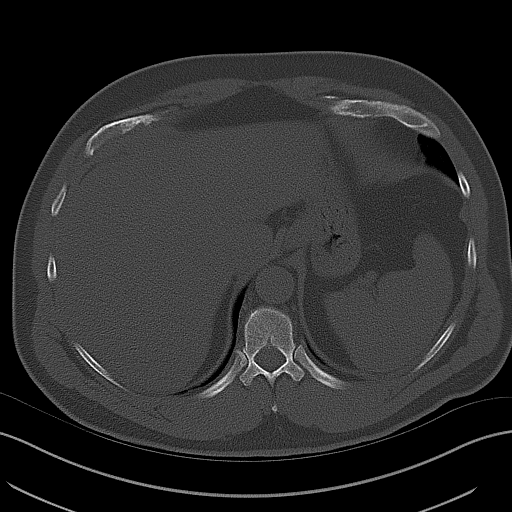
[im 3/13  soft-tissue]
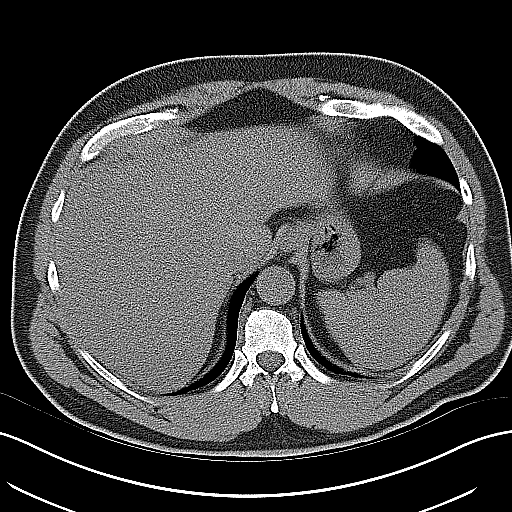
[im 4/13  soft-tissue]
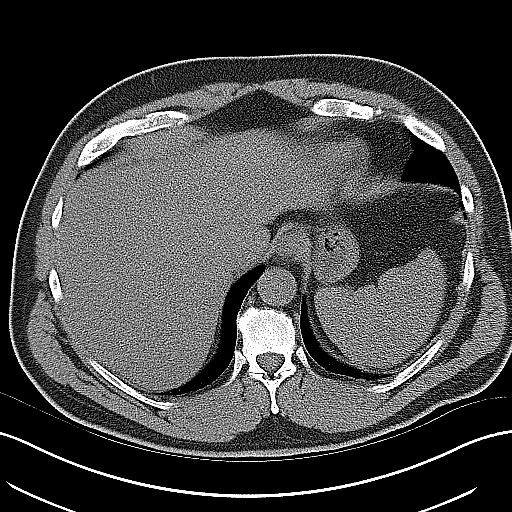
[im 5/13  soft-tissue]
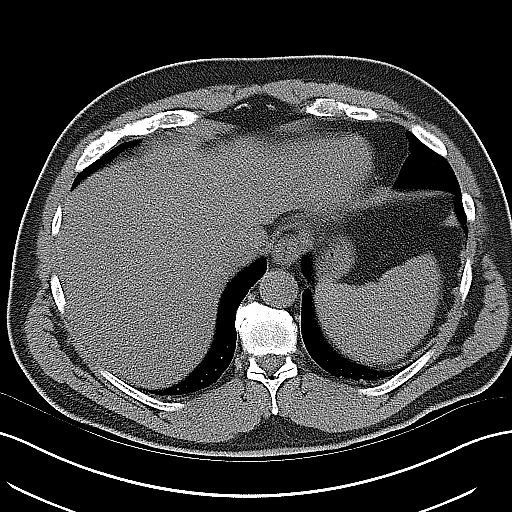
[im 6/13  soft-tissue]
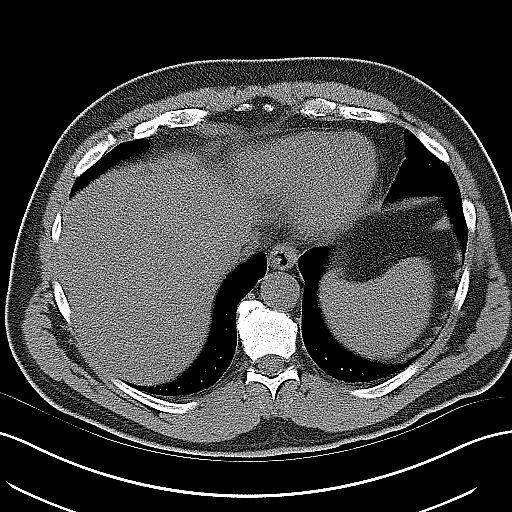
[im 7/13  soft-tissue]
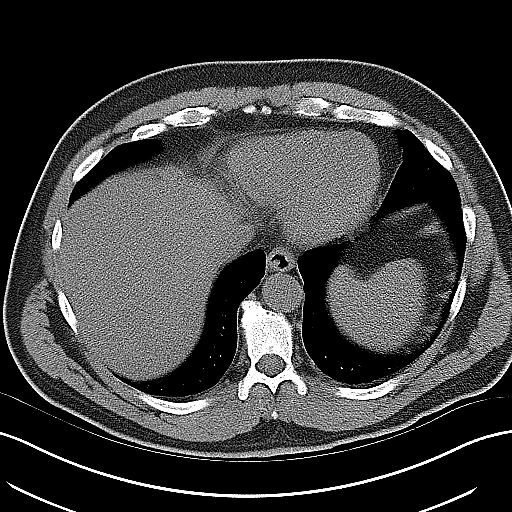
[im 8/13  soft-tissue]
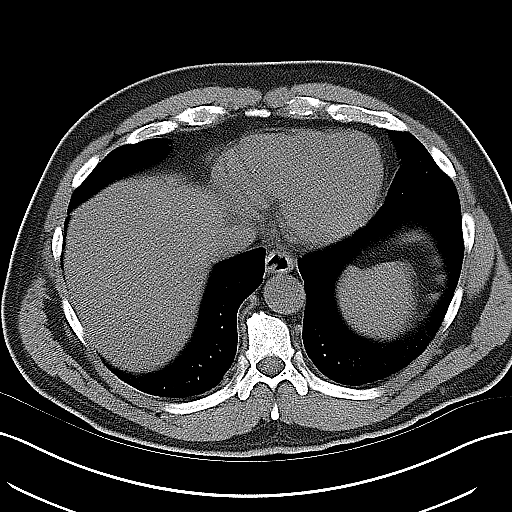
[im 9/13  soft-tissue]
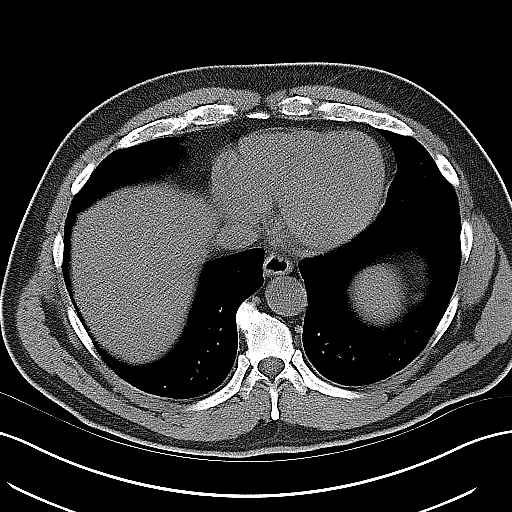
[im 9/13  lung]
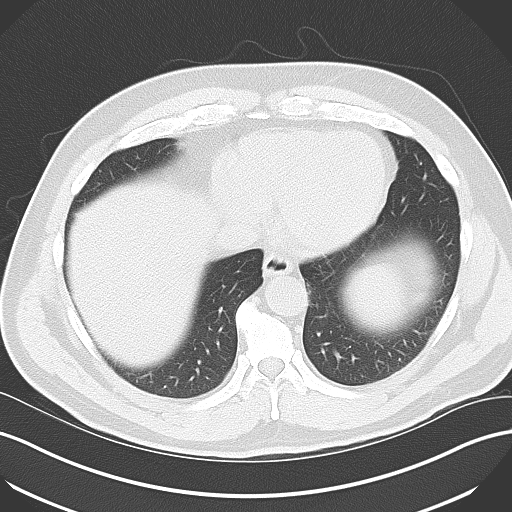
[im 10/13  soft-tissue]
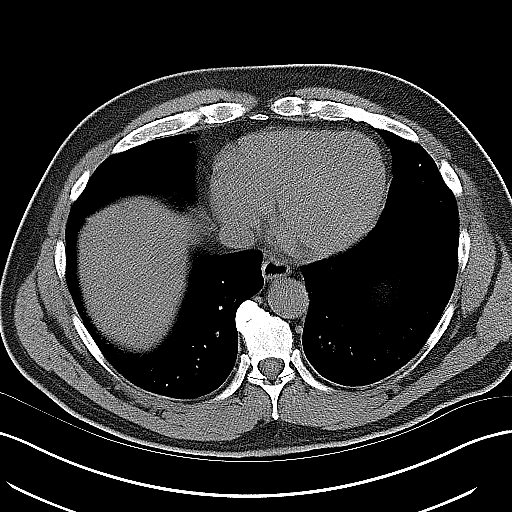
[im 10/13  lung]
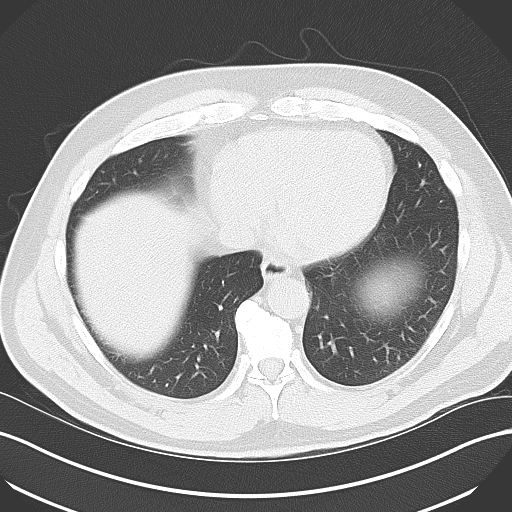
[im 10/13  bone]
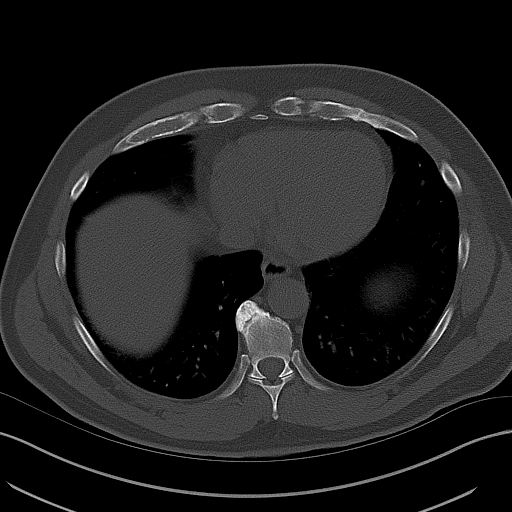
[im 11/13  soft-tissue]
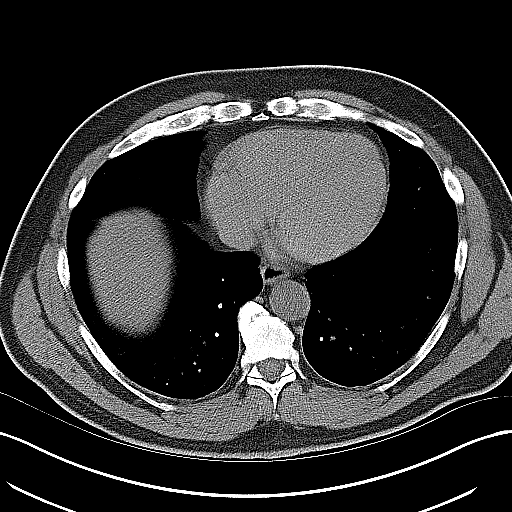
[im 11/13  lung]
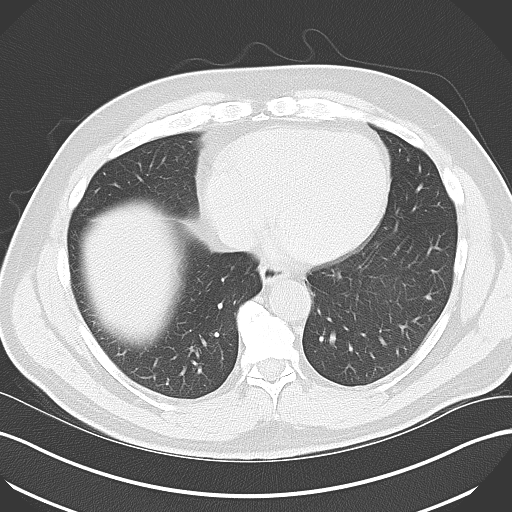
[im 12/13  soft-tissue]
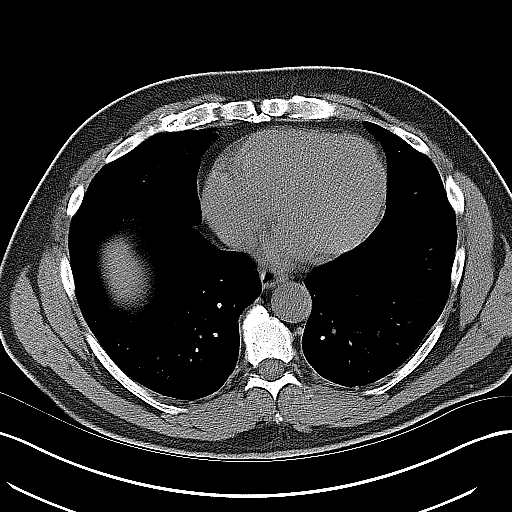
[im 12/13  lung]
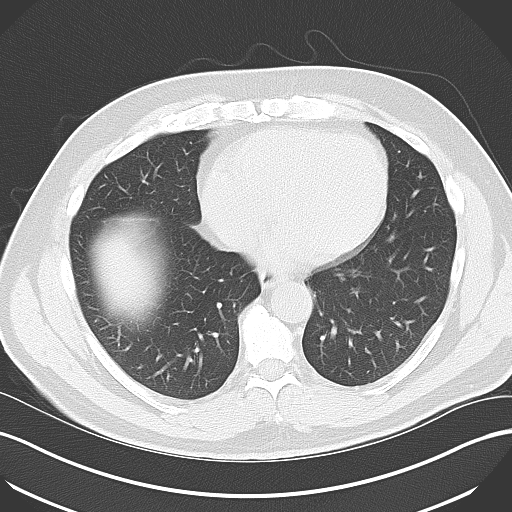

[11 of 13 positions shown; findings below may reference images not displayed]

FINDINGS: The visualized lung bases are clear.

The liver and spleen are unremarkable in appearance. The gallbladder
is within normal limits. The pancreas and adrenal glands are
unremarkable.

There is moderate left-sided hydronephrosis, with left-sided
perinephric stranding, and prominence of the left ureter along its
entire course. A 6 mm stone is noted at the left side of the base of
the bladder. This may reflect a recently passed stone, or a stone
just barely within the left vesicoureteral junction.

The right kidney is unremarkable in appearance. No nonobstructing
renal stones are identified.

No free fluid is identified. The small bowel is unremarkable in
appearance. The stomach is within normal limits. No acute vascular
abnormalities are seen.

The appendix is decompressed and difficult to fully assess, without
evidence for appendicitis. The colon is unremarkable appearance.

The bladder is decompressed and not well assessed. The prostate
remains normal in size. No inguinal lymphadenopathy is seen.

No acute osseous abnormalities are identified.
IMPRESSION: Moderate left-sided hydronephrosis, with diffuse prominence of the
left ureter. A 6 mm stone is noted at the left side of the base of
the bladder. This may reflect a recently passed stone, or a stone
just barely within the left vesicoureteral junction.
